# Patient Record
Sex: Male | Born: 1955 | Race: White | Hispanic: No | Marital: Married | Smoking: Never smoker
Health system: Southern US, Community
[De-identification: ages and names within clinical notes are randomized; demographics above are authoritative.]

## PROBLEM LIST (undated history)

## (undated) DIAGNOSIS — T8859XA Other complications of anesthesia, initial encounter: Secondary | ICD-10-CM

## (undated) DIAGNOSIS — I10 Essential (primary) hypertension: Secondary | ICD-10-CM

## (undated) DIAGNOSIS — K219 Gastro-esophageal reflux disease without esophagitis: Secondary | ICD-10-CM

## (undated) DIAGNOSIS — C801 Malignant (primary) neoplasm, unspecified: Secondary | ICD-10-CM

## (undated) DIAGNOSIS — T4145XA Adverse effect of unspecified anesthetic, initial encounter: Secondary | ICD-10-CM

## (undated) DIAGNOSIS — M1990 Unspecified osteoarthritis, unspecified site: Secondary | ICD-10-CM

## (undated) HISTORY — PX: JOINT REPLACEMENT: SHX530

## (undated) HISTORY — PX: APPENDECTOMY: SHX54

## (undated) HISTORY — PX: OTHER SURGICAL HISTORY: SHX169

## (undated) HISTORY — PX: CARPAL TUNNEL RELEASE: SHX101

## (undated) HISTORY — PX: BACK SURGERY: SHX140

---

## 1997-10-01 ENCOUNTER — Emergency Department (HOSPITAL_COMMUNITY): Admission: EM | Admit: 1997-10-01 | Discharge: 1997-10-01 | Payer: Self-pay | Admitting: Emergency Medicine

## 2000-04-05 ENCOUNTER — Ambulatory Visit (HOSPITAL_COMMUNITY): Admission: RE | Admit: 2000-04-05 | Discharge: 2000-04-05 | Payer: Self-pay | Admitting: *Deleted

## 2000-04-05 ENCOUNTER — Encounter (INDEPENDENT_AMBULATORY_CARE_PROVIDER_SITE_OTHER): Payer: Self-pay

## 2000-05-06 ENCOUNTER — Encounter: Payer: Self-pay | Admitting: Family Medicine

## 2000-05-06 ENCOUNTER — Encounter: Admission: RE | Admit: 2000-05-06 | Discharge: 2000-05-06 | Payer: Self-pay | Admitting: Family Medicine

## 2002-08-28 ENCOUNTER — Encounter: Payer: Self-pay | Admitting: Orthopedic Surgery

## 2002-08-28 ENCOUNTER — Encounter: Admission: RE | Admit: 2002-08-28 | Discharge: 2002-08-28 | Payer: Self-pay | Admitting: Orthopedic Surgery

## 2003-03-27 ENCOUNTER — Ambulatory Visit (HOSPITAL_COMMUNITY): Admission: RE | Admit: 2003-03-27 | Discharge: 2003-03-27 | Payer: Self-pay | Admitting: Orthopedic Surgery

## 2003-03-27 ENCOUNTER — Ambulatory Visit (HOSPITAL_BASED_OUTPATIENT_CLINIC_OR_DEPARTMENT_OTHER): Admission: RE | Admit: 2003-03-27 | Discharge: 2003-03-27 | Payer: Self-pay | Admitting: Orthopedic Surgery

## 2003-09-17 ENCOUNTER — Ambulatory Visit (HOSPITAL_COMMUNITY): Admission: RE | Admit: 2003-09-17 | Discharge: 2003-09-17 | Payer: Self-pay | Admitting: *Deleted

## 2003-09-17 ENCOUNTER — Encounter (INDEPENDENT_AMBULATORY_CARE_PROVIDER_SITE_OTHER): Payer: Self-pay | Admitting: Specialist

## 2004-01-02 ENCOUNTER — Ambulatory Visit (HOSPITAL_COMMUNITY): Admission: RE | Admit: 2004-01-02 | Discharge: 2004-01-02 | Payer: Self-pay | Admitting: Orthopedic Surgery

## 2004-01-02 ENCOUNTER — Ambulatory Visit (HOSPITAL_BASED_OUTPATIENT_CLINIC_OR_DEPARTMENT_OTHER): Admission: RE | Admit: 2004-01-02 | Discharge: 2004-01-02 | Payer: Self-pay | Admitting: Orthopedic Surgery

## 2005-08-17 ENCOUNTER — Ambulatory Visit (HOSPITAL_COMMUNITY): Admission: RE | Admit: 2005-08-17 | Discharge: 2005-08-18 | Payer: Self-pay | Admitting: Neurosurgery

## 2006-05-04 ENCOUNTER — Ambulatory Visit: Payer: Self-pay | Admitting: Cardiology

## 2006-05-20 ENCOUNTER — Ambulatory Visit: Payer: Self-pay

## 2006-05-20 ENCOUNTER — Encounter: Payer: Self-pay | Admitting: Cardiology

## 2006-05-26 ENCOUNTER — Ambulatory Visit: Payer: Self-pay | Admitting: Cardiology

## 2008-06-05 ENCOUNTER — Inpatient Hospital Stay (HOSPITAL_COMMUNITY): Admission: RE | Admit: 2008-06-05 | Discharge: 2008-06-07 | Payer: Self-pay | Admitting: Orthopedic Surgery

## 2010-06-23 ENCOUNTER — Other Ambulatory Visit: Payer: Self-pay | Admitting: Dermatology

## 2010-07-27 ENCOUNTER — Encounter (HOSPITAL_BASED_OUTPATIENT_CLINIC_OR_DEPARTMENT_OTHER)
Admission: RE | Admit: 2010-07-27 | Discharge: 2010-07-27 | Disposition: A | Discharge status: Home / Self Care | Payer: BC Managed Care – PPO | Source: Ambulatory Visit | Attending: Orthopedic Surgery | Admitting: Orthopedic Surgery

## 2010-07-27 LAB — POCT I-STAT, CHEM 8
Creatinine, Ser: 1 mg/dL (ref 0.4–1.5)
Hemoglobin: 14.6 g/dL (ref 13.0–17.0)
Sodium: 142 mEq/L (ref 135–145)
TCO2: 30 mmol/L (ref 0–100)

## 2010-07-28 ENCOUNTER — Ambulatory Visit (HOSPITAL_BASED_OUTPATIENT_CLINIC_OR_DEPARTMENT_OTHER)
Admission: RE | Admit: 2010-07-28 | Discharge: 2010-07-28 | Disposition: A | Discharge status: Home / Self Care | Payer: BC Managed Care – PPO | Source: Ambulatory Visit | Attending: Orthopedic Surgery | Admitting: Orthopedic Surgery

## 2010-07-28 DIAGNOSIS — Z85820 Personal history of malignant melanoma of skin: Secondary | ICD-10-CM | POA: Insufficient documentation

## 2010-07-28 DIAGNOSIS — G5600 Carpal tunnel syndrome, unspecified upper limb: Secondary | ICD-10-CM | POA: Insufficient documentation

## 2010-07-28 DIAGNOSIS — I10 Essential (primary) hypertension: Secondary | ICD-10-CM | POA: Insufficient documentation

## 2010-07-28 DIAGNOSIS — Z01812 Encounter for preprocedural laboratory examination: Secondary | ICD-10-CM | POA: Insufficient documentation

## 2010-07-28 DIAGNOSIS — E669 Obesity, unspecified: Secondary | ICD-10-CM | POA: Insufficient documentation

## 2010-07-28 LAB — POCT HEMOGLOBIN-HEMACUE: Hemoglobin: 14.2 g/dL (ref 13.0–17.0)

## 2010-08-11 LAB — CBC
MCHC: 35.1 g/dL (ref 30.0–36.0)
MCHC: 35.3 g/dL (ref 30.0–36.0)
MCV: 95.5 fL (ref 78.0–100.0)
MCV: 97.1 fL (ref 78.0–100.0)
Platelets: 203 10*3/uL (ref 150–400)
Platelets: 245 10*3/uL (ref 150–400)
Platelets: 258 10*3/uL (ref 150–400)
RBC: 3.43 MIL/uL — ABNORMAL LOW (ref 4.22–5.81)
RDW: 13.1 % (ref 11.5–15.5)
RDW: 13.3 % (ref 11.5–15.5)
WBC: 7 10*3/uL (ref 4.0–10.5)
WBC: 9.8 10*3/uL (ref 4.0–10.5)

## 2010-08-11 LAB — COMPREHENSIVE METABOLIC PANEL
AST: 22 U/L (ref 0–37)
Albumin: 4.1 g/dL (ref 3.5–5.2)
Chloride: 101 mEq/L (ref 96–112)
Creatinine, Ser: 0.87 mg/dL (ref 0.4–1.5)
GFR calc Af Amer: >60 mL/min (ref >60)
Total Bilirubin: 0.8 mg/dL (ref 0.3–1.2)
Total Protein: 7 g/dL (ref 6.0–8.3)

## 2010-08-11 LAB — TYPE AND SCREEN
ABO/RH(D): A POS
Antibody Screen: NEGATIVE

## 2010-08-11 LAB — BASIC METABOLIC PANEL
BUN: 12 mg/dL (ref 6–23)
BUN: 19 mg/dL (ref 6–23)
CO2: 30 mEq/L (ref 19–32)
CO2: 32 mEq/L (ref 19–32)
Calcium: 8.5 mg/dL (ref 8.4–10.5)
Calcium: 8.7 mg/dL (ref 8.4–10.5)
Chloride: 102 mEq/L (ref 96–112)
Creatinine, Ser: 0.88 mg/dL (ref 0.4–1.5)
GFR calc Af Amer: >60 mL/min (ref >60)
GFR calc non Af Amer: >60 mL/min (ref >60)
Glucose, Bld: 132 mg/dL — ABNORMAL HIGH (ref 70–99)

## 2010-08-11 LAB — URINALYSIS, ROUTINE W REFLEX MICROSCOPIC
Bilirubin Urine: NEGATIVE
Nitrite: NEGATIVE
Specific Gravity, Urine: 1.012 (ref 1.005–1.030)
Urobilinogen, UA: 0.2 mg/dL (ref 0.0–1.0)
pH: 6.5 (ref 5.0–8.0)

## 2010-08-11 LAB — PROTIME-INR
INR: 1 (ref 0.00–1.49)
INR: 1.1 (ref 0.00–1.49)
Prothrombin Time: 14.5 seconds (ref 11.6–15.2)

## 2010-08-11 LAB — APTT: aPTT: 25 seconds (ref 24–37)

## 2010-08-24 ENCOUNTER — Encounter (HOSPITAL_BASED_OUTPATIENT_CLINIC_OR_DEPARTMENT_OTHER)
Admission: RE | Admit: 2010-08-24 | Discharge: 2010-08-24 | Disposition: A | Discharge status: Home / Self Care | Payer: BC Managed Care – PPO | Source: Ambulatory Visit | Attending: Orthopedic Surgery | Admitting: Orthopedic Surgery

## 2010-08-24 LAB — BASIC METABOLIC PANEL
BUN: 17 mg/dL (ref 6–23)
Calcium: 9.5 mg/dL (ref 8.4–10.5)
GFR calc non Af Amer: >60 mL/min (ref >60)
Glucose, Bld: 125 mg/dL — ABNORMAL HIGH (ref 70–99)
Potassium: 5.2 mEq/L — ABNORMAL HIGH (ref 3.5–5.1)

## 2010-08-26 ENCOUNTER — Ambulatory Visit (HOSPITAL_BASED_OUTPATIENT_CLINIC_OR_DEPARTMENT_OTHER)
Admission: RE | Admit: 2010-08-26 | Discharge: 2010-08-26 | Disposition: A | Discharge status: Home / Self Care | Payer: BC Managed Care – PPO | Source: Ambulatory Visit | Attending: Orthopedic Surgery | Admitting: Orthopedic Surgery

## 2010-08-26 DIAGNOSIS — E669 Obesity, unspecified: Secondary | ICD-10-CM | POA: Insufficient documentation

## 2010-08-26 DIAGNOSIS — M65849 Other synovitis and tenosynovitis, unspecified hand: Secondary | ICD-10-CM | POA: Insufficient documentation

## 2010-08-26 DIAGNOSIS — I10 Essential (primary) hypertension: Secondary | ICD-10-CM | POA: Insufficient documentation

## 2010-08-26 DIAGNOSIS — M65839 Other synovitis and tenosynovitis, unspecified forearm: Secondary | ICD-10-CM | POA: Insufficient documentation

## 2010-08-26 DIAGNOSIS — Z01812 Encounter for preprocedural laboratory examination: Secondary | ICD-10-CM | POA: Insufficient documentation

## 2010-08-26 DIAGNOSIS — G5600 Carpal tunnel syndrome, unspecified upper limb: Secondary | ICD-10-CM | POA: Insufficient documentation

## 2010-08-26 LAB — POCT HEMOGLOBIN-HEMACUE: Hemoglobin: 14.3 g/dL (ref 13.0–17.0)

## 2010-09-08 NOTE — Op Note (Signed)
NAMEJHOSTIN, EPPS               ACCOUNT NO.:  [id number]   MEDICAL RECORD NO.:  [id number]          PATIENT TYPE:  INP   LOCATION:  5024                         FACILITY:  [facility]   PHYSICIAN:  Loreta Ave, M.D. DATE OF BIRTH:  1955-07-01   DATE OF PROCEDURE:  06/05/2008  DATE OF DISCHARGE:                               OPERATIVE REPORT   PREOPERATIVE DIAGNOSIS:  Right knee end-stage degenerative arthritis  with varus alignment.   POSTOPERATIVE DIAGNOSIS:  Right knee end-stage degenerative arthritis  with varus alignment.   PROCEDURE:  Right total knee replacement with Stryker Triathlon  prosthesis.  Modified minimally invasive approach.  Cemented pegged  posterior stabilized #6 femoral component.  Cemented #6 tibial component  with 9-mm polyethylene insert.  Resurfacing peg cemented medial offset  35-mm patellar component.  Soft tissue balancing and medial capsular  release.   SURGEON:  Loreta Ave, MD   ASSISTANT:  Genene Churn. Barry Dienes, [place], present throughout the entire case and  necessary for timely completion of the procedure.   ANESTHESIA:  General.   ESTIMATED BLOOD LOSS:  Minimal.   SPECIMENS:  None.   CULTURES:  None.   COMPLICATIONS:  None.   DRESSINGS:  Sterile compressive.   TOURNIQUET TIME:  1 hour.   DRAIN:  Hemovac x1.   PROCEDURE:  The patient brought to the operating room, placed on the  operating table in supine position.  After adequate anesthesia had been  obtained, right knee examined.  Still fairly good extension and flexion.  Varus alignment correctable to neutral.  Stable ligaments.  Tourniquet  applied.  Prepped and draped in usual sterile fashion.  Exsanguinated  with elevation of Esmarch, tourniquet inflated to 350 mmHg.  Straight  incision above the patella down to the tibial tubercle.  Medial  arthrotomy, vastus splitting preserving quad tendon.  Knee exposed.  Grade 4 changes throughout.  Medial capsular release.  Periarticular  spurs, loose bodies, remnants of menisci, cruciate ligaments excised.  Distal femur exposed.  Intramedullary guide placed.  Distal cut 5  degrees of valgus, 10 mm of resection.  Using epicondylar axis, the  femur was sized, cut, and fitted for a #6 component.  Proximal tibia  exposed.  Extramedullary guide.  A 3-degree posterior slope cut below  the medial defect.  Sized to #6 component.  After soft tissue balancing,  trial was in place.  A #6 on the femur and #6 on the tibia.  With a 9-mm  insert, I had a nicely balanced knee with full extension, full flexion,  good alignment, and good stability.  Patella exposed.  Spurs removed.  Posterior 10 mm removed.  Sized, drilled, and fitted for a 35-mm  component with excellent tracking at completion.  All trials removed.  All recess examined to be sure all loose fragments and spurs removed.  Copious irrigation with a pulse irrigating device.  Cement prepared and  placed on all components, which were firmly seated.  Polyethylene  attached to the tibia, knee reduced.  Once these cement had hardened,  knee was examined.  Full extension, full flexion, good alignment,  good  stability, good patellofemoral tracking.  Hemovac was placed, brought  out through a separate stab wound.  Arthrotomy closed with #1 Vicryl,  skin and subcutaneous tissue with Vicryls and was stable.  Knee injected  with Marcaine.  Hemovac clamped.  Sterile compressive dressing applied.  Tourniquet deflated and removed.  Knee immobilizer applied.  Anesthesia  reversed.  Brought to the recovery room.  Tolerated the surgery well.  No complications.      Loreta Ave, M.D.  Electronically Signed     DFM/MEDQ  D:  06/05/2008  T:  06/06/2008  Job:  [id number]

## 2010-09-11 NOTE — Assessment & Plan Note (Signed)
[facility]                            CARDIOLOGY OFFICE NOTE   NAME:[name]                      MRN:          [id number]  DATE:05/04/2006                            DOB:          February 11, 1956    REFERRING PHYSICIAN:  Ursula Beath, MD   REASON FOR CONSULTATION:  Dizziness, chest pain, and PVCs.   HISTORY OF PRESENT ILLNESS:  [name] is a pleasant 55 year old male  with obesity, long-standing hypertension, mildly elevated LDL  cholesterol documented in the past at 129, and a family history of  premature cardiovascular disease.  He has no personal history of  coronary artery disease and states he had a cardiac catheterization  approximately 10 to 15 years ago that was normal.  He has not had any  interval ischemic followup.  Symptomatically, he has been stable until  the last 6 to 8 weeks.  He has been experiencing intermittent brief  episodes of dizziness/light-headedness associated with a sense of  palpitations and discomfort in his neck.  He has had some feeling of  racing heart in the evenings, and also a feeling of chest tightness with  these symptoms, but not specifically with activity.  He has had no frank  syncope and denies any reproducible exertional chest discomfort.  He  wore a 24h Holter monitor, which reported premature ventricular  complexes with some ventricular bigeminy and trigeminy, but no sustained  arrhythmias.  His resting electrocardiogram today shows sinus rhythm  with a single premature ventricular complex, and no evidence of  transmural infarct.  He is somewhat functionally limited due to what  sounds like fairly significant osteoarthritis involving the knees.  He  has not been exercising regularly and states that his weight has  increased.  He has also had some increased psychosocial stressors.   ALLERGIES:  PENICILLIN.   PRESENT MEDICATIONS:  1. Zyrtec 10 mg p.o. daily.  2. Quinaretic 20/12.5 mg p.o.  daily.  3. Aspirin 81 mg p.o. daily.  4. Vitamin B6 100 mg p.o. daily.  5. Osteo Bi-Flex 2 daily.   PAST MEDICAL HISTORY:  As outlined above.  Additional problems include  arthroscopic surgery on both knees over the last few years.  He has also  had a lower lumbar laminectomy.   SOCIAL HISTORY:  The patient is married.  He has 2 children.  He works  as a Merchandiser, retail.  He denies any tobacco use.  He drinks  alcohol socially and on the weekends.   FAMILY HISTORY:  Significant for heart disease in the patient's father  who had a myocardial infarction at age 81.   REVIEW OF SYSTEMS:  As described in the history of present illness.  He  does have some seasonal allergies.   EXAMINATION:  Blood pressure 145/80, heart rate is 80, weight 283  pounds.  This is a morbidly obese male in no acute distress.  HEENT:  Conjunctivae was normal.  Oropharynx was clear.  NECK:  Supple without elevated jugular venous pressure or loud bruits.  No thyromegaly is noted.  LUNGS:  Clear without labored breathing at rest.  CARDIAC:  Regular rate and rhythm without loud murmur or S3 gallop.  There is no pericardial rub.  ABDOMEN:  Soft.  No obvious hepatomegaly.  No loud bruits or tenderness.  EXTREMITIES:  Show 1+ edema below the knees.  SKIN:  Warm and dry.  Tattoos noted on upper arms and left chest.  No  ulcerative changes are noted.  MUSCULOSKELETAL:  No kyphosis noted.  NEURO/PSYCHIATRIC:  The patient is alert and oriented x3.   IMPRESSION/RECOMMENDATIONS:  1. Recent symptoms of palpitations, dizziness, and chest pain as      outlined above.  Resting electrocardiogram is fairly nonspecific.      Premature ventricular complexes have been noted on Holter      monitoring.  He has had no syncope and his symptoms have been brief      and not clearly associated with exertion.  He has at least moderate      pre-test probability for coronary artery disease and has not had      any recent  screening since a reported remote cardiac      catheterization.  We discussed this today and our plan will be an      adenosine Myoview and a 2D echocardiogram with subsequent followup      to review the results.  We can then proceed from there.  2. Further plans to follow.     Jonelle Sidle, MD  Electronically Signed    SGM/MedQ  DD: 05/04/2006  DT: 05/04/2006  Job #: [id number]   cc:   Ursula Beath, MD

## 2010-09-11 NOTE — Procedures (Signed)
[facility]  Patient:    [name], [name]                      MRN: [id number] Proc. Date: 04/05/00 Adm. Date:  [id number] Disc. Date: [id number] Attending:  Sabino Gasser CCNolon Nations, M.D., M.P.H.   Procedure Report  PROCEDURE:  Colonoscopy with polypectomy.  INDICATIONS:  See dictated clinical note.  ANESTHESIA: Demerol 110 mg and Versed 11 mg.  PROCEDURE:  With the patient mildly sedated in the left lateral decubitus position, the Olympus video endoscopic colonoscope was inserted in the rectum after a normal rectal exam was performed and was passed easily under direct vision to the cecum.  The cecum was identified by the ileocecal valve and appendiceal orifice, both of which were photographed.  From this point, the colonoscope was slowly withdrawn, taking circumferential views in the entire colonic mucosa, stopping at 20 cm from the anal verge, at which point a small polyp approximately 4 mm in size, round, sessile.  This was seen, photographed, and removed using snare cautery technique at a setting of 3 and 3.1 current.  The tissue was retrieved for pathology, and the scope was withdrawn further to the rectum, which appeared normal.  Direct views showed internal hemorrhoids in retroflexed views.  The endoscope was straightened and withdrawn.  The patients vital signs and pulse oximeter remained stable.  The patient tolerated the procedure well without apparent complications.  FINDINGS: 1. Small polyp at 20 cm. 2. Internal hemorrhoids.  PLAN:  Await biopsy report.  The patient will call me for results and follow up with me as an outpatient. DD:  04/05/00 TD:  04/05/00 Job: [id number]

## 2010-09-11 NOTE — Op Note (Signed)
[name], [name]                         ACCOUNT NO.:  [id number]   MEDICAL RECORD NO.:  [id number]                   PATIENT TYPE:  AMB   LOCATION:  DSC                                  FACILITY:  [facility]   PHYSICIAN:  Loreta Ave, M.D.              DATE OF BIRTH:  [birth year]   DATE OF PROCEDURE:  01/02/2004  DATE OF DISCHARGE:                                 OPERATIVE REPORT   PREOPERATIVE DIAGNOSIS:  Osteochondral defect medial femoral condyle, left  knee.   POSTOPERATIVE DIAGNOSIS:  Osteochondral defect medial femoral condyle, left  knee, with some grade 2 chondromalacia of patella.   PROCEDURE:  Left knee exam under anesthesia, arthroscopy, chondroplasty of  patella and medial femoral condyle, open medial arthrotomy with  osteochondral allograft arthroplasty, Oats procedure, 20 mm osteochondral  graft plus a 15 mm osteochondral graft, all to the medial femoral condyle.   SURGEON:  Loreta Ave, M.D.   ASSISTANT:  [name], P.A.-C.   ANESTHESIA:  General.   ESTIMATED BLOOD LOSS:  Minimal.   TOURNIQUET TIME:  1 hour 30 minutes.   SPECIMENS:  None.   CULTURES:  None.   COMPLICATIONS:  None.   DRESSINGS:  Soft compressive with knee immobilizer.   PROCEDURE:  The patient was brought to the operating room and placed on the  operating table in supine position.  After adequate anesthesia had been  obtained, the left knee was examined.  Full motion, good stability.  Tourniquet applied, prepped and draped in the usual sterile fashion.  Exsanguinated with elevation of Esmarch and tourniquet inflated to 350 mmHg.  Three portals were created, one superolateral and one each medial and  lateral parapatellar.  Inflow catheter introduced, knee distended,  arthroscope introduced, knee inspected.  Some grade 2 changes of the patella  easily debrided.  Significant improvement from his chondroplasty in 2004.  Good tracking.  Lateral meniscus, lateral  compartment, and cruciate  ligaments intact.  The medial compartment had a grade 4 lesion, weight  bearing dome, medial femoral condyle about 18 mm in diameter from side to  side but then extending almost 25 mm from the posterior to anterior aspect.  Much of the weight-bearing dome of the condyle.  Previous grade 4 on his  previous arthroscopy with a little bit of fibrocartilage covering it,  nothing protective, and not significant cartilage.  The remaining joint  space looked good, the remaining articular cartilage on the tibia looked  good.  Sufficient focal changes to allow treatment with an Oats allograft  transplantation arthroplasty.  The instruments and fluid were removed.   Medial parapatellar arthrotomy.  Medial joint space exposed protecting  medial meniscus.  The lesion on the condyle was identified and sized for two  grafts, a 20 mm circular graft on the posterior aspect and a 15 mm circular  graft just anterior to that.  This gave, at completion, nice restoration  of  articular cartilage over almost the entire weight bearing dome medial  femoral condyle filling the defects nicely.  With the Oats instrumentation,  it was sized first for the 20 mm posterior graft.  The allograft, which was  a fresh allograft, prepared and put in the appropriate cutting jig to match  the sizing for the graft to be implanted.  The posterior graft was created  with first a guide-wire appropriate sizing and then routing to a depth of 10  mm which gave good bone throughout and a reasonable bed of  normal articular  cartilage all the way around except very anterior aspect where a second  graft was to be placed.  Measured for 10 mm graft.  The same place on the  allograft condyle was then chosen and with the appropriate jig, a graft was  harvested.  It was cut for appropriate depth and sized for the recipient  site.  The donor graft, after being appropriately prepared and sized, was  carefully driven  into place into the defect and seated extremely well with a  good recontouring of the entire articular cartilage surface and excellent  bony capturing.  Care was taken to make this the same contour and height of  the remaining normal articular cartilage.  When that was complete, the exact  same procedure was done just anterior to the other defect creating an 8 mm  deep, 15 mm diameter, recipient site.  Again, the autograft was used and the  graft harvested from the same area on the allograft.  Once that was sized  for 8 mm depth, it was driven into place.  At completion, both grafts had  excellent restoration of articular cartilage over the area of defect.  Both  fit well, contoured smoothly, and were of the normal height of articular  cartilage and well captured and positioned.  I was pleased with the overall  restoration of articular cartilage and appearance.  The wound and knee was  thoroughly irrigated.  The arthrotomy closed with Vicryl deep and then  subcuticular Vicryl superficial, the portals were closed with nylon.  Margins of the wound injected with Marcaine.  Sterile compressive dressing  applied.  Tourniquet deflated and removed.  Knee immobilizer applied.  Anesthesia reversed.  Brought to the recovery room.  Tolerated the surgery  well without complications.                                               Loreta Ave, M.D.    DFM/MEDQ  D:  01/03/2004  T:  01/03/2004  Job:  [id number]

## 2010-09-11 NOTE — Op Note (Signed)
[name], [name]                         ACCOUNT NO.:  [id number]   MEDICAL RECORD NO.:  [id number]                   PATIENT TYPE:  AMB   LOCATION:  DSC                                  FACILITY:  [facility]   PHYSICIAN:  Loreta Ave, M.D.              DATE OF BIRTH:  [birth year]   DATE OF PROCEDURE:  03/27/2003  DATE OF DISCHARGE:                                 OPERATIVE REPORT   PREOPERATIVE DIAGNOSES:  Right knee chondromalacia patella, chondromalacia  medial compartment and medial meniscal tear.   POSTOPERATIVE DIAGNOSES:  Right knee chondromalacia patella, chondromalacia  medial compartment and medial meniscal tear with numerous chondral loose  bodies.   OPERATION PERFORMED:  Right knee examination under anesthesia, arthroscopy  with partial medial meniscectomy.  Removal of chondral loose bodies.  Chondroplasty patella and medial femoral condyle.   SURGEON:  Loreta Ave, M.D.   ASSISTANT:  Arlys John D. Petrarca, P.A.-C.   ANESTHESIA:  General.   ESTIMATED BLOOD LOSS:  Minimal.   TOURNIQUET TIME:  Not employed.   SPECIMENS:  None.   CULTURES:  None.   COMPLICATIONS:  None.   DRESSING:  Soft compressive.   DESCRIPTION OF PROCEDURE:  The patient was brought to the operating room and  after adequate anesthesia had been obtained, the right knee examined.  Good  motion and stability.  Tourniquet and leg holder applied.  Leg prepped and  draped in the usual sterile fashion.  Three portals were created, one  superolateral, one each medial and lateral parapatellar.  Inflow catheter  introduced.  Knee distended.  Arthroscope introduced.  Knee inspected.  Numerous chondral loose bodies throughout.  All were removed.  Most of these  were felt to be coming from the medial femoral condyle lesion.  Grade 3  lesion diffusely on the patella at the peak as well as  medial facet.  Chondroplasty to a stable surface.  Reasonable tracking, no tethering and  the trochlea  looked good.  Smooth surface on the patella at completion.  Cruciate ligaments intact.  Lateral compartment, lateral meniscus normal.  Medial compartment extensive tearing medial meniscus posterior half.  Taken  all the way out to the rim, tapered in to remaining meniscus, salvaging the  anterior half.  Diffuse grade 3 chondral changes entire weightbearing dome,  medial femoral condyle.  Chondroplasty to a stable surface removing loose  bodies.  The tibial plateau looked good. This was a diffuse area of thinning  over the entire weightbearing region medial femoral condyle.  At completion  all  recess examined to be sure all loose bodies were removed.  Instruments and  fluid removed.  Portals of the knee injected with Marcaine.  Portals were  closed with 4-0 nylon.  Sterile compressive dressing applied.  Anesthesia  reversed.  Brought to recovery room.  Tolerated surgery well.  No  complications.  Loreta Ave, M.D.    DFM/MEDQ  D:  03/27/2003  T:  03/27/2003  Job:  [id number]

## 2010-09-11 NOTE — Op Note (Signed)
[name], [name]                         ACCOUNT NO.:  [id number]   MEDICAL RECORD NO.:  [id number]                   PATIENT TYPE:  AMB   LOCATION:  ENDO                                 FACILITY:  [facility]   PHYSICIAN:  Georgiana Spinner, M.D.                 DATE OF BIRTH:  10-19-1955   DATE OF PROCEDURE:  DATE OF DISCHARGE:                                 OPERATIVE REPORT   PROCEDURE PERFORMED:  Colonoscopy.   ENDOSCOPIST:  Georgiana Spinner, M.D.   INDICATIONS FOR PROCEDURE:  Colon polyps.   ANESTHESIA:  Demerol 25 mg, Versed 3 mg.   DESCRIPTION OF PROCEDURE:  With the patient mildly sedated in the left  lateral decubitus position, the Olympus video colonoscope was inserted in  the rectum and passed under direct vision to the cecum, identified by the  ileocecal valve and appendiceal orifice, both of which were photographed.  From this point the colonoscope was slowly withdrawn taking circumferential  views of the colonic mucosa stopping at 20 cm from the anal verge at which  point a small polyp was seen, photographed and removed using hot biopsy  forceps technique setting 20/200 with the Erbe pulse generator.  The  endoscope was then withdrawn to the rectum which appeared normal on direct  and retroflex view.  The endoscope was straightened and withdrawn.  The  patient's vital signs and pulse oximeter remained stable.  The patient  tolerated the procedure well without apparent complications.   FINDINGS:  Small polyp at 20 cm from the anal verge.  Otherwise unremarkable  colonoscopic examination to the cecum.   PLAN:  Await biopsy report.  Patient will call me for results and follow up  with me as an outpatient.                                               Georgiana Spinner, M.D.    GMO/MEDQ  D:  09/17/2003  T:  09/17/2003  Job:  [id number]   cc:   Marjory Lies, M.D.  [address]  [place]  [place] [address]  Fax: [phone]

## 2010-09-11 NOTE — Op Note (Signed)
[name], [name]                         ACCOUNT NO.:  [id number]   MEDICAL RECORD NO.:  [id number]                   PATIENT TYPE:  AMB   LOCATION:  ENDO                                 FACILITY:  [facility]   PHYSICIAN:  Georgiana Spinner, M.D.                 DATE OF BIRTH:  May 06, 1955   DATE OF PROCEDURE:  09/17/2003  DATE OF DISCHARGE:                                 OPERATIVE REPORT   PROCEDURE PERFORMED:  Upper endoscopy.   ENDOSCOPIST:  Georgiana Spinner, M.D.   INDICATIONS FOR PROCEDURE:  Gastroesophageal reflux disease.   ANESTHESIA:  Demerol 75 mg, Versed 5 mg.   DESCRIPTION OF PROCEDURE:  With the patient mildly sedated in the left  lateral decubitus position, the Olympus video endoscope was inserted in the  mouth and passed under direct vision through the esophagus which appeared  normal.  There was no evidence of Barrett's.  We entered into the stomach.  The fundus, body, antrum, duodenal bulb and second portion of the duodenum  all appeared normal.  From this point, the endoscope was slowly withdrawn  taking circumferential views of the duodenal mucosa until the endoscope was  pulled back into the stomach and placed on retroflexion to view the stomach  from below.  The endoscope was then straightened and withdrawn taking  circumferential views of the remaining gastric and esophageal mucosa.  The  patient's vital signs and pulse oximeter remained stable.  The patient  tolerated the procedure well without apparent complications.   FINDINGS:  Unremarkable examination without evidence of Barrett's esophagus.   PLAN:  Proceed to colonoscopy.                                               Georgiana Spinner, M.D.    GMO/MEDQ  D:  09/17/2003  T:  09/17/2003  Job:  [id number]   cc:   Marjory Lies, M.D.  [address]  [place]  [place] [address]  Fax: [phone]

## 2010-09-11 NOTE — Assessment & Plan Note (Signed)
[facility]                            CARDIOLOGY OFFICE NOTE   NAME:Sistrunk, TRUNG WENZL                      MRN:          [id number]  DATE:05/26/2006                            DOB:          [birth year]    FOLLOWUP VISIT   PRIMARY CARE PHYSICIAN:  Dr. Ursula Beath.   REASON FOR VISIT:  Followup cardiac testing.   HISTORY OF PRESENT ILLNESS:  I saw Mr. Gortney back on January 9.  His  history is detailed on previous note.  I arranged cardiac risk  stratification for further assessment and he underwent an adenosine  Myoview which revealed no evidence of ischemia or scar with a normal  ejection fraction of 60%.  He also underwent a 2-D echocardiogram, also  showing normal left ventricular systolic function of 60% with no  regional wall motion abnormalities or major valvular abnormalities.  I  reviewed these with the patient and his wife today.  He continues to  have some symptoms of palpitations and we talked about stress reduction,  weight loss, and more of an effort at exercise and risk factor  modification.  I explained to him that his testing was overall low risk  and is good at excluding severe flow limiting coronary artery disease  but does not pick up mild atherosclerosis which would still need to be  addressed via risk factor modification, although would not necessarily  give him symptoms or require invasive treatment.  We talked about some  strategies to consider as far as weight loss and exercise and he tells  me that he will plan to pursue this.   ALLERGIES:  PENICILLIN.   PRESENT MEDICATIONS:  1. Zyrtec 10 mg p.o. daily.  2. Quinaretic 20/12.5 mg p.o. daily.  3. Aspirin 81 mg p.o. daily.  4. Vitamin B-6 100 mg p.o. daily.  5. Osteo Bi-Flex 2 tablets daily.   REVIEW OF SYSTEMS:  As described in the history of present illness.   EXAMINATION:  Blood pressure is 139/86.  Heart rate is 74.  Weight is  280 pounds.  He is comfortable  in no acute distress.  No major change in his baseline  examination.   IMPRESSION/RECOMMENDATION:  1. History of palpitations, occasional dizziness and intermittent      chest pain, although with reassuring low-risk cardiac testing as      well as a history of previous normal cardiac catheterization      approximately 10 to 15 years ago.  I suspect that stress, increased      weight, and deconditioning are all factors here and I have      recommended aggressive risk factor modification including follow up      with his primary care Anshika Pethtel, exercise, and a goal of weight      loss.  He seems motivated to pursue this and, at this point, we      will not anticipate      any additional cardiac studies.  2. Continue regular followup with Dr. Raquel James.  We can certainly see      him back as needed.  Jonelle Sidle, MD  Electronically Signed    SGM/MedQ  DD: 05/26/2006  DT: 05/26/2006  Job #: [id number]   cc:   Ursula Beath, MD

## 2010-09-11 NOTE — Op Note (Signed)
NAMECAMRON, [name]               ACCOUNT NO.:  [id number]   MEDICAL RECORD NO.:  [id number]          PATIENT TYPE:  OIB   LOCATION:  3014                         FACILITY:  [facility]   PHYSICIAN:  Reinaldo Meeker, M.D. DATE OF BIRTH:  09/10/1955   DATE OF PROCEDURE:  08/17/2005  DATE OF DISCHARGE:                                 OPERATIVE REPORT   PREOPERATIVE DIAGNOSIS:  Spondylosis, L4-5, with bilateral L5 radiculopathy.   POSTOPERATIVE DIAGNOSIS:  Spondylosis, L4-5, with bilateral L5  radiculopathy.   PROCEDURE:  Bilateral L4-5 decompressive laminotomies.   SECONDARY PROCEDURE:  Microdissection, L5 nerve root bilaterally.   SURGEON:  Reinaldo Meeker, M.D.   ASSISTANT:  Kathaleen Maser. Pool, M.D.   PROCEDURE IN DETAIL:  After placed in the prone position, the patient's back  was prepped and draped in the usual sterile fashion.  Localizing x-rays were  taken prior to incision to identify the appropriate level.  A midline  incision was made over the spinous processes of L4 and L5.  Using the Bovie,  __________, and curette, the incision was carried down to the spinous  processes.  Subperiosteal dissection was then carried out bilaterally on the  spinous processes and lamina.  A self-retaining retractor was placed for  exposure.  X-ray showed we had approached the appropriate level.  Starting  on the patient's left side, laminotomy was performed by removing the  inferior one-half of the L4 lamina, the medial one-third of the facet joint,  the superior one-third of the L5 lamina.  Residual bone and hypertrophic  ligamentum flavum were removed in a piecemeal fashion.  Similar  decompression was then carried out on the patient's right side, once again  removing the inferior one half of the L4 lamina, the medial one third of the  facet joint, and the superior one third of the L5 lamina.  Once again,  residual bone and ligamentum flavum were removed in a piecemeal fashion.  At  this time,  using microdissection technique, generous foraminotomies were  carried out at the L5 nerve roots bilaterally.  Inspection was carried out  at the floor of the canal to identify the L4-5 disk, which was found to be  unremarkable.  At this time, inspection was carried out bilaterally for any  evidence of residual compression, and none could be identified.  Large  amounts of irrigation were carried out.  Any bleeding was controlled with  coagulation and Gelfoam.  The wound was then closed in multiple layers of  Vicryl on the muscle, fascia, subcutaneous, and subcuticular tissues, and  staples were placed on the skin.  A sterile dressing was then applied, and  the patient was extubated and taken to the recovery room in stable  condition.           ______________________________  Reinaldo Meeker, M.D.     ROK/MEDQ  D:  08/17/2005  T:  08/17/2005  Job:  [id number]

## 2010-11-13 NOTE — Op Note (Signed)
NAMEKRISTA, [name]               ACCOUNT NO.:  [id number]  MEDICAL RECORD NO.:  [id number]          PATIENT TYPE:  LOCATION:                                 FACILITY:  PHYSICIAN:  Cindee Salt, M.D.            DATE OF BIRTH:  DATE OF PROCEDURE:  08/26/2010 DATE OF DISCHARGE:                              OPERATIVE REPORT   PREOPERATIVE DIAGNOSIS:  Carpal tunnel syndrome, right hand, stenosing tenosynovitis, right thumb.  POSTOPERATIVE DIAGNOSIS:  Carpal tunnel syndrome, right hand, stenosing tenosynovitis, right thumb.  OPERATION:  Decompression median nerve, right wrist.  Release of A1 pulley, right thumb.  SURGEON:  Cindee Salt, MD  ANESTHESIA:  General with local infiltration.  DATE OF OPERATION:  Aug 26, 2010.  ANESTHESIOLOGIST:  Dr. Gelene Mink.  HISTORY:  The patient is a 55 year old male with a history of carpal tunnel syndrome, EMG nerve conductions positive, triggering of his right thumb which has not responded to conservative treatments.  He has elected to undergo surgical decompression of each problem.  He is aware of risks and complications including infection, recurrence injury to arteries, nerves, tendons, incomplete relief of symptoms, dystrophy.  In preoperative area, the patient was seen.  The extremity was marked by both the patient and surgeon.  Antibiotic given.  PROCEDURE:  The patient was brought to the operating room where a general anesthetic was carried out without difficulty.  He was prepped using ChloraPrep, supine position, right arm free, a 3-minute dry time was allowed.  Time-out taken confirming the patient and procedure.  The limb was exsanguinated with an Esmarch bandage.  Tourniquet was placed on the forearm was inflated to 250 mmHg.  A straight incision was made over the palm carried down through subcutaneous tissue.  Bleeders were electrocauterized, palmar fascia was split, superficial palmar arch was identified.  The flexor tendon to  the ring little finger identified to the ulnar side of the median nerve.  The carpal retinaculum was incised with sharp dissection.  Right-angle and Sewall retractor were placed between the skin and forearm fascia.  Fascia was released for approximately a centimeter and a half proximal to the wrist crease under direct vision.  Anomalous muscle appeared to be in palmaris profundus was noted.  This was not removed.  Wound was irrigated.  Area compression to the nerve was apparent.  No further lesions were identified.  The wound was closed with interrupted 5-0 Vicryl Rapide sutures.  Separate incision was then made transversely at the metacarpophalangeal joint crease of the thumb carried down through subcutaneous tissue.  Bleeders again electrocauterized.  Neurovascular structures were identified and protected to the radial side of the A1 pulley.  The A1 pulley was released.  The oblique pulley was left intact.  Finger placed through full range motion, no further triggering was noted.  The wound was irrigated.  Skin was closed with interrupted 5- 0 Vicryl Rapide sutures.  A local infiltration with 0.25% Marcaine without epinephrine was given in each incision.  A sterile compressive dressing and splint to the wrist with fingers free was applied. Deflation of the tourniquet.  All fingers immediately pinked.  He was taken to the recovery room for observation in satisfactory condition. He will be discharged home to return to the [facility] of [place] in 1 week on Vicodin.          ______________________________ Cindee Salt, M.D.     GK/MEDQ  D:  08/26/2010  T:  08/27/2010  Job:  [id number]  Electronically Signed by Cindee Salt M.D. on 11/13/2010 09:06:06 AM

## 2010-11-13 NOTE — Op Note (Signed)
  NAMEMERRILL, [name]               ACCOUNT NO.:  [id number]  MEDICAL RECORD NO.:  [id number]          PATIENT TYPE:  LOCATION:                                 FACILITY:  PHYSICIAN:  Cindee Salt, M.D.            DATE OF BIRTH:  DATE OF PROCEDURE:  07/28/2010 DATE OF DISCHARGE:                              OPERATIVE REPORT   PREOPERATIVE DIAGNOSIS:  Carpal tunnel syndrome left hand.  POSTOPERATIVE DIAGNOSIS:  Carpal tunnel syndrome left hand.  OPERATION:  Decompression left median nerve.  SURGEON:  Cindee Salt, MD  ASSISTANT:  Betha Loa, MD  ANESTHESIA:  Forearm based IV regional with local infiltration.  ANESTHESIOLOGIST:  Dr. Jean Rosenthal.  HISTORY:  The patient is a 55 year old male with a history of carpal tunnel syndrome, EMG nerve conduction is positive which has not responded to conservative treatment.  He has elected to undergo surgical decompression.  Pre, peri and postoperative course have been discussed along with risks and complications.  He is aware that there is no guarantee with surgery, possibility of infection, recurrence injury to arteries, nerves, tendons, incomplete relief of symptoms and dystrophy. Preoperative area the patient is seen.  The extremity marked by both the patient and surgeon.  Antibiotic given.  PROCEDURE:  The patient is brought to the operating room where forearm based IV regional anesthetic was carried out without difficulty.  He was prepped using ChloraPrep, supine position, left arm free.  A 3-minute dry time was allowed.  Time-out taken confirming the patient and procedure.  He had some continued feeling.  A local anesthetic was given with 0.25% Marcaine without epinephrine in the line of the incision approximately 8 mL was used.  A longitudinal incision was made and carried down through subcutaneous tissue.  Bleeders were electrocauterized.  Palmar fascia was split, superficial palmar arch identified, flexor tendon to the ring and  little finger identified.  To the ulnar side of median nerve, the carpal retinaculum was incised with sharp dissection.  A right-angle and Sewall retractor were placed between skin and forearm fascia.  The fascia released for approximately a centimeter and half proximal to the wrist crease under direct vision. Canal was explored.  Area of compression to the nerve was apparent. Tenosynovial tissue was moderately thickened.  The wound was irrigated, and the skin closed with interrupted 5-0 Vicryl Rapide sutures.  Sterile compressive dressing and splint to the wrist with the fingers free was applied.  Deflation of the tourniquet, all fingers immediately pinked.  He was taken to the recovery room for observation in satisfactory condition.  He will be discharged home to return to the [facility] of [place] in 1 week on Vicodin.          ______________________________ Cindee Salt, M.D.     GK/MEDQ  D:  07/28/2010  T:  07/29/2010  Job:  [id number]  Electronically Signed by Cindee Salt M.D. on 11/13/2010 09:05:56 AM

## 2011-02-03 ENCOUNTER — Encounter (HOSPITAL_BASED_OUTPATIENT_CLINIC_OR_DEPARTMENT_OTHER)
Admission: RE | Admit: 2011-02-03 | Discharge: 2011-02-03 | Disposition: A | Discharge status: Home / Self Care | Payer: BC Managed Care – PPO | Source: Ambulatory Visit | Attending: Orthopedic Surgery | Admitting: Orthopedic Surgery

## 2011-02-03 LAB — BASIC METABOLIC PANEL
BUN: 17 mg/dL (ref 6–23)
Calcium: 9.8 mg/dL (ref 8.4–10.5)
Creatinine, Ser: 0.94 mg/dL (ref 0.50–1.35)
GFR calc Af Amer: >90 mL/min (ref >90)
GFR calc non Af Amer: >90 mL/min (ref >90)

## 2011-02-08 ENCOUNTER — Ambulatory Visit (HOSPITAL_BASED_OUTPATIENT_CLINIC_OR_DEPARTMENT_OTHER)
Admission: RE | Admit: 2011-02-08 | Discharge: 2011-02-08 | Disposition: A | Discharge status: Home / Self Care | Payer: BC Managed Care – PPO | Source: Ambulatory Visit | Attending: Orthopedic Surgery | Admitting: Orthopedic Surgery

## 2011-02-08 DIAGNOSIS — M6740 Ganglion, unspecified site: Secondary | ICD-10-CM | POA: Insufficient documentation

## 2011-02-08 DIAGNOSIS — M6530 Trigger finger, unspecified finger: Secondary | ICD-10-CM | POA: Insufficient documentation

## 2011-02-08 DIAGNOSIS — Z01810 Encounter for preprocedural cardiovascular examination: Secondary | ICD-10-CM | POA: Insufficient documentation

## 2011-02-08 DIAGNOSIS — M65839 Other synovitis and tenosynovitis, unspecified forearm: Secondary | ICD-10-CM | POA: Insufficient documentation

## 2011-02-08 DIAGNOSIS — Z01812 Encounter for preprocedural laboratory examination: Secondary | ICD-10-CM | POA: Insufficient documentation

## 2011-02-08 LAB — POCT HEMOGLOBIN-HEMACUE: Hemoglobin: 15 g/dL (ref 13.0–17.0)

## 2011-02-09 NOTE — Op Note (Signed)
  [name], [name]               ACCOUNT NO.:  [id number]  MEDICAL RECORD NO.:  [id number]  LOCATION:                                 FACILITY:  PHYSICIAN:  Cindee Salt, M.D.       DATE OF BIRTH:  01-Feb-1956  DATE OF PROCEDURE:  02/08/2011 DATE OF DISCHARGE:                              OPERATIVE REPORT   PREOPERATIVE DIAGNOSIS: Stenosing tenosynovitis, left thumb.  POSTOPERATIVE DIAGNOSIS:  Stenosing tenosynovitis, left thumb plus flexor sheath cyst.  OPERATION:  Release A1 pulley, excision flexor sheath cyst, left thumb.  SURGEON:  Cindee Salt, MD  ANESTHESIA:  General with local infiltration.  ANESTHESIOLOGIST:  Zenon Mayo, MD  HISTORY:  The patient is a 55 year old male with a history of triggering of his left thumb, this has been painful for him and has not responded to conservative treatment.  He has elected to undergo surgical release of the A1 pulley.  Pre, peri, and postoperative course have been discussed along with risks and complications.  He is aware there is no guarantee with the surgery, possibility of infection, recurrence injury to arteries, nerves, tendons, incomplete relief of symptoms and dystrophy.  Preoperative area the patient is seen.  The extremity marked by both the patient and surgeon.  Antibiotic given.  PROCEDURE:  The patient was brought to the operating room where a general anesthetic was carried out without difficulty.  He was prepped using ChloraPrep, supine position with the left arm free.  The limb was exsanguinated with an Esmarch bandage, 3 minutes dry time allowed.  Time- out taken confirming the patient and procedure.  A transverse incision was made over the A1 pulley of the left thumb and carried down through the subcutaneous tissue.  A cyst was immediately encountered.  With blunt and sharp dissection, this was dissected free and removed.  The A1 pulley was then released on its radial aspect protecting  neurovascular structures radially and ulnarly.  No further triggering was noted.  The wound was irrigated.  The skin closed with interrupted 4-0 Vicryl Rapide sutures.  The local infiltration with 0.25% Marcaine without epinephrine was given.  Sterile compressive dressing was applied. On deflation of the tourniquet, all fingers immediately pinked.  He was taken to the recovery room for observation in satisfactory condition. He will be discharged home to return to the [facility] of [place] in 1 week on Vicodin.          ______________________________ Cindee Salt, M.D.     GK/MEDQ  D:  02/08/2011  T:  02/09/2011  Job:  [id number]  Electronically Signed by Cindee Salt M.D. on 02/09/2011 04:54:09 PM

## 2011-09-27 ENCOUNTER — Other Ambulatory Visit: Payer: Self-pay | Admitting: Dermatology

## 2012-03-28 ENCOUNTER — Other Ambulatory Visit: Payer: Self-pay | Admitting: Dermatology

## 2013-07-02 ENCOUNTER — Other Ambulatory Visit: Payer: Self-pay | Admitting: Dermatology

## 2014-01-10 ENCOUNTER — Other Ambulatory Visit: Payer: Self-pay | Admitting: Dermatology

## 2014-08-06 ENCOUNTER — Other Ambulatory Visit: Payer: Self-pay | Admitting: Dermatology

## 2014-11-12 ENCOUNTER — Other Ambulatory Visit: Payer: Self-pay | Admitting: Physician Assistant

## 2014-11-12 NOTE — H&P (Signed)
TOTAL KNEE ADMISSION H&P  Patient is being admitted for left total knee arthroplasty.  Subjective:  Chief Complaint:left knee pain.  HPI: [name], 59 y.o. male, has a history of pain and functional disability in the left knee due to arthritis and has failed non-surgical conservative treatments for greater than 12 weeks to includeNSAID's and/or analgesics and corticosteriod injections.  Onset of symptoms was gradual, starting 5 years ago with gradually worsening course since that time. The patient noted no past surgery on the left knee(s).  Patient currently rates pain in the left knee(s) at 7 out of 10 with activity. Patient has night pain and crepitus.  Patient has evidence of subchondral sclerosis and joint space narrowing by imaging studies. There is no active infection.  There are no active problems to display for this patient.  No past medical history on file.  No past surgical history on file.   (Not in a hospital admission) Allergies not on file  History  Substance Use Topics  . Smoking status: Not on file  . Smokeless tobacco: Not on file  . Alcohol Use: Not on file    No family history on file.   Review of Systems  Constitutional: Negative.   HENT: Negative.   Eyes: Negative.   Respiratory: Negative.   Cardiovascular: Negative.   Gastrointestinal: Negative.   Genitourinary: Negative.   Musculoskeletal: Positive for joint pain.  Skin: Negative.   Neurological: Negative.   Endo/Heme/Allergies: Negative.   Psychiatric/Behavioral: Negative.     Objective:  Physical Exam  Constitutional: He is oriented to person, place, and time. He appears well-developed and well-nourished.  HENT:  Head: Normocephalic and atraumatic.  Eyes: EOM are normal. Pupils are equal, round, and reactive to light.  Neck: Normal range of motion. Neck supple.  Cardiovascular: Normal rate and regular rhythm.   Respiratory: Effort normal and breath sounds normal. No respiratory  distress. He has no wheezes. He has no rales.  GI: Soft. Bowel sounds are normal.  Musculoskeletal:  Examination of his left knee reveals range of motion from 0-120 degrees.  Marked patellofemoral crepitus.  Medial joint line tenderness.  Nothing laterally.    Neurological: He is alert and oriented to person, place, and time.  Skin: Skin is warm and dry.  Psychiatric: He has a normal mood and affect. His behavior is normal. Judgment and thought content normal.    Vital signs in last 24 hours: @VSRANGES @  Labs:   There is no height or weight on file to calculate BMI.   Imaging Review Plain radiographs demonstrate severe degenerative joint disease of the left knee(s). The overall alignment isneutral. The bone quality appears to be fair for age and reported activity level.  Assessment/Plan:  End stage arthritis, left knee   The patient history, physical examination, clinical judgment of the provider and imaging studies are consistent with end stage degenerative joint disease of the left knee(s) and total knee arthroplasty is deemed medically necessary. The treatment options including medical management, injection therapy arthroscopy and arthroplasty were discussed at length. The risks and benefits of total knee arthroplasty were presented and reviewed. The risks due to aseptic loosening, infection, stiffness, patella tracking problems, thromboembolic complications and other imponderables were discussed. The patient acknowledged the explanation, agreed to proceed with the plan and consent was signed. Patient is being admitted for inpatient treatment for surgery, pain control, PT, OT, prophylactic antibiotics, VTE prophylaxis, progressive ambulation and ADL's and discharge planning. The patient is planning to be discharged home with  home health services

## 2014-11-15 ENCOUNTER — Encounter (HOSPITAL_COMMUNITY): Payer: Self-pay

## 2014-11-15 ENCOUNTER — Encounter (HOSPITAL_COMMUNITY)
Admission: RE | Admit: 2014-11-15 | Discharge: 2014-11-15 | Disposition: A | Discharge status: Home / Self Care | Payer: BLUE CROSS/BLUE SHIELD | Source: Ambulatory Visit | Attending: Orthopedic Surgery | Admitting: Orthopedic Surgery

## 2014-11-15 DIAGNOSIS — Z01818 Encounter for other preprocedural examination: Secondary | ICD-10-CM | POA: Diagnosis present

## 2014-11-15 DIAGNOSIS — Z96651 Presence of right artificial knee joint: Secondary | ICD-10-CM | POA: Diagnosis not present

## 2014-11-15 DIAGNOSIS — Z0183 Encounter for blood typing: Secondary | ICD-10-CM | POA: Insufficient documentation

## 2014-11-15 DIAGNOSIS — Z79899 Other long term (current) drug therapy: Secondary | ICD-10-CM | POA: Diagnosis not present

## 2014-11-15 DIAGNOSIS — K219 Gastro-esophageal reflux disease without esophagitis: Secondary | ICD-10-CM | POA: Diagnosis not present

## 2014-11-15 DIAGNOSIS — R9431 Abnormal electrocardiogram [ECG] [EKG]: Secondary | ICD-10-CM | POA: Diagnosis not present

## 2014-11-15 DIAGNOSIS — Z85820 Personal history of malignant melanoma of skin: Secondary | ICD-10-CM | POA: Diagnosis not present

## 2014-11-15 DIAGNOSIS — I10 Essential (primary) hypertension: Secondary | ICD-10-CM | POA: Diagnosis not present

## 2014-11-15 DIAGNOSIS — Z7982 Long term (current) use of aspirin: Secondary | ICD-10-CM | POA: Diagnosis not present

## 2014-11-15 DIAGNOSIS — M179 Osteoarthritis of knee, unspecified: Secondary | ICD-10-CM | POA: Insufficient documentation

## 2014-11-15 DIAGNOSIS — Z01812 Encounter for preprocedural laboratory examination: Secondary | ICD-10-CM | POA: Diagnosis not present

## 2014-11-15 HISTORY — DX: Gastro-esophageal reflux disease without esophagitis: K21.9

## 2014-11-15 HISTORY — DX: Unspecified osteoarthritis, unspecified site: M19.90

## 2014-11-15 HISTORY — DX: Other complications of anesthesia, initial encounter: T88.59XA

## 2014-11-15 HISTORY — DX: Adverse effect of unspecified anesthetic, initial encounter: T41.45XA

## 2014-11-15 HISTORY — DX: Essential (primary) hypertension: I10

## 2014-11-15 HISTORY — DX: Malignant (primary) neoplasm, unspecified: C80.1

## 2014-11-15 LAB — APTT: APTT: 25 s (ref 24–37)

## 2014-11-15 LAB — CBC WITH DIFFERENTIAL/PLATELET
Basophils Absolute: 0 10*3/uL (ref 0.0–0.1)
Basophils Relative: 0 % (ref 0–1)
EOS PCT: 4 % (ref 0–5)
Eosinophils Absolute: 0.3 10*3/uL (ref 0.0–0.7)
HCT: 41 % (ref 39.0–52.0)
Hemoglobin: 14.1 g/dL (ref 13.0–17.0)
LYMPHS PCT: 31 % (ref 12–46)
Lymphs Abs: 2.2 10*3/uL (ref 0.7–4.0)
MCH: 32.8 pg (ref 26.0–34.0)
MCHC: 34.4 g/dL (ref 30.0–36.0)
MCV: 95.3 fL (ref 78.0–100.0)
MONO ABS: 0.4 10*3/uL (ref 0.1–1.0)
MONOS PCT: 6 % (ref 3–12)
NEUTROS ABS: 4.1 10*3/uL (ref 1.7–7.7)
NEUTROS PCT: 59 % (ref 43–77)
PLATELETS: 244 10*3/uL (ref 150–400)
RBC: 4.3 MIL/uL (ref 4.22–5.81)
RDW: 13.2 % (ref 11.5–15.5)
WBC: 7.1 10*3/uL (ref 4.0–10.5)

## 2014-11-15 LAB — COMPREHENSIVE METABOLIC PANEL
ALT: 20 U/L (ref 17–63)
ANION GAP: 6 (ref 5–15)
AST: 19 U/L (ref 15–41)
Albumin: 3.8 g/dL (ref 3.5–5.0)
Alkaline Phosphatase: 38 U/L (ref 38–126)
BILIRUBIN TOTAL: 0.5 mg/dL (ref 0.3–1.2)
BUN: 16 mg/dL (ref 6–20)
CALCIUM: 9.6 mg/dL (ref 8.9–10.3)
CO2: 29 mmol/L (ref 22–32)
CREATININE: 1.01 mg/dL (ref 0.61–1.24)
Chloride: 103 mmol/L (ref 101–111)
GFR calc Af Amer: >60 mL/min (ref >60)
Glucose, Bld: 145 mg/dL — ABNORMAL HIGH (ref 65–99)
POTASSIUM: 4 mmol/L (ref 3.5–5.1)
Sodium: 138 mmol/L (ref 135–145)
Total Protein: 6.6 g/dL (ref 6.5–8.1)

## 2014-11-15 LAB — TYPE AND SCREEN
ABO/RH(D): A POS
Antibody Screen: NEGATIVE

## 2014-11-15 LAB — PROTIME-INR
INR: 0.96 (ref 0.00–1.49)
Prothrombin Time: 13 seconds (ref 11.6–15.2)

## 2014-11-15 LAB — SURGICAL PCR SCREEN
MRSA, PCR: NEGATIVE
Staphylococcus aureus: POSITIVE — AB

## 2014-11-15 NOTE — Progress Notes (Signed)
Message left for patient regarding PCR results ,Rx called to [facility] on [address].

## 2014-11-15 NOTE — Progress Notes (Signed)
This patient tested at an elevated risk for obstructive sleep apnea during a pre-surgical visit using the STOP BANG TOOL. A score of 4 or greater is considered an elevated risk.

## 2014-11-15 NOTE — Pre-Procedure Instructions (Signed)
    [name]  11/15/2014      [id number] - [place], [address] [address] Phone: [phone] Fax: [phone]    Your procedure is scheduled on 11-27-2014   Wednesday    Report to [facility] Admitting at 6:30 A.M.   Call this number if you have problems the morning of surgery:  [phone]   Remember:  Do not eat food or drink liquids after midnight.   Take these medicines the morning of surgery with A SIP OF WATER Pain medication if needed   Do not wear jewelry,  Do not wear lotions, powders, or perfumes.    Do not shave 48 hours prior to surgery.  Men may shave face and neck.\   Do not bring valuables to the hospital.  [facility] is not responsible for any belongings or valuables.  Contacts, dentures or bridgework may not be worn into surgery.  Leave your suitcase in the car.  After surgery it may be brought to your room.  For patients admitted to the hospital, discharge time will be determined by your treatment team.  Patients discharged the day of surgery will not be allowed to drive home.   Special instructions:  See attached sheet "Preparing for Surgery" for instructions on CHG  Shower/bath    Please read over the following fact sheets that you were given. Pain Booklet, Coughing and Deep Breathing, Blood Transfusion Information and Surgical Site Infection Prevention

## 2014-11-16 LAB — URINE CULTURE: CULTURE: NO GROWTH

## 2014-11-18 ENCOUNTER — Encounter (HOSPITAL_COMMUNITY): Payer: Self-pay

## 2014-11-18 NOTE — Progress Notes (Signed)
Anesthesia Chart Review: Patient is a 59 year old male scheduled for left TKR on 11/27/14 by Dr. Kathryne Hitch.  History includes non-smoker, HTN, arthritis, GERD, melanoma (back) excision, right TKR, L4-5 laminotomies '07, knee arthroscopies, carpal tunnel release. For anesthesia history, he reported waking up during one of his surgeries. (He has had multiple surgeries at [facility], some under GA and others with MAC.)  BMI is consistent with morbid obesity. OSA screening score was 4 or greater.  PCP is listed as Dr. Juanita Craver.  He is not followed by a cardiologist but was seen by Dr. Domenic Polite back in 2008 for evaluation of palpitations with associated chest tightness. Holter showed occasional PVCs but no sustained arrhythmias. He also had an unremarkable stress and echo and PRN follow-up was recommended.  Meds include ASA 81 mg, Norco, Krill oil, Benicar HCT, Vitamin E.  11/15/14 EKG: NSR, cannot rule out anterior infarct (age undetermined). Reverse r wave progression in V2-3 is new since 02/03/11. No CV symptoms were documented from his PAT visit.   05/20/06 Echo: Overall left ventricular systolic function was normal. Left ventricular ejection fraction was estimated to be 60 %. There was no diagnostic evidence of left ventricular regional wall motion abnormalities.  According to 05/26/06 cardiology notes, "... He underwent an adenosine Myoview [in 04/2006] which revealed no evidence of ischemia or scar with a normalejection fraction of 60%."  Preoperative labs noted.   If patient remains asymptomatic from a CV standpoint then I would anticipate that he could proceed as planned.   [facility] Short Stay Center/Anesthesiology Phone [phone] 11/18/2014 6:57 PM

## 2014-11-26 MED ORDER — DEXTROSE 5 % IV SOLN
3.0000 g | INTRAVENOUS | Status: AC
  Administered 2014-11-27: 3 g via INTRAVENOUS
  Filled 2014-11-26: qty 3000

## 2014-11-27 ENCOUNTER — Inpatient Hospital Stay (HOSPITAL_COMMUNITY): Payer: BLUE CROSS/BLUE SHIELD | Admitting: Certified Registered Nurse Anesthetist

## 2014-11-27 ENCOUNTER — Encounter (HOSPITAL_COMMUNITY): Payer: Self-pay | Admitting: *Deleted

## 2014-11-27 ENCOUNTER — Encounter (HOSPITAL_COMMUNITY)
Admission: RE | Disposition: A | Discharge status: Home Health | Payer: Self-pay | Source: Ambulatory Visit | Attending: Orthopedic Surgery

## 2014-11-27 ENCOUNTER — Inpatient Hospital Stay (HOSPITAL_COMMUNITY): Payer: BLUE CROSS/BLUE SHIELD

## 2014-11-27 ENCOUNTER — Inpatient Hospital Stay (HOSPITAL_COMMUNITY)
Admission: RE | Admit: 2014-11-27 | Discharge: 2014-11-29 | DRG: 470 | Disposition: A | Discharge status: Home Health | Payer: BLUE CROSS/BLUE SHIELD | Source: Ambulatory Visit | Attending: Orthopedic Surgery | Admitting: Orthopedic Surgery

## 2014-11-27 ENCOUNTER — Inpatient Hospital Stay (HOSPITAL_COMMUNITY): Payer: BLUE CROSS/BLUE SHIELD | Admitting: Vascular Surgery

## 2014-11-27 DIAGNOSIS — Y92230 Patient room in hospital as the place of occurrence of the external cause: Secondary | ICD-10-CM | POA: Diagnosis not present

## 2014-11-27 DIAGNOSIS — Z6841 Body Mass Index (BMI) 40.0 and over, adult: Secondary | ICD-10-CM | POA: Diagnosis not present

## 2014-11-27 DIAGNOSIS — F419 Anxiety disorder, unspecified: Secondary | ICD-10-CM | POA: Diagnosis present

## 2014-11-27 DIAGNOSIS — I10 Essential (primary) hypertension: Secondary | ICD-10-CM | POA: Diagnosis present

## 2014-11-27 DIAGNOSIS — Z96659 Presence of unspecified artificial knee joint: Secondary | ICD-10-CM

## 2014-11-27 DIAGNOSIS — R110 Nausea: Secondary | ICD-10-CM | POA: Diagnosis not present

## 2014-11-27 DIAGNOSIS — T402X5A Adverse effect of other opioids, initial encounter: Secondary | ICD-10-CM | POA: Diagnosis not present

## 2014-11-27 DIAGNOSIS — M25562 Pain in left knee: Secondary | ICD-10-CM | POA: Diagnosis present

## 2014-11-27 DIAGNOSIS — K219 Gastro-esophageal reflux disease without esophagitis: Secondary | ICD-10-CM | POA: Diagnosis present

## 2014-11-27 DIAGNOSIS — M1712 Unilateral primary osteoarthritis, left knee: Principal | ICD-10-CM | POA: Diagnosis present

## 2014-11-27 DIAGNOSIS — M1710 Unilateral primary osteoarthritis, unspecified knee: Secondary | ICD-10-CM | POA: Diagnosis present

## 2014-11-27 DIAGNOSIS — D62 Acute posthemorrhagic anemia: Secondary | ICD-10-CM | POA: Diagnosis not present

## 2014-11-27 DIAGNOSIS — M179 Osteoarthritis of knee, unspecified: Secondary | ICD-10-CM | POA: Diagnosis present

## 2014-11-27 DIAGNOSIS — G4440 Drug-induced headache, not elsewhere classified, not intractable: Secondary | ICD-10-CM | POA: Diagnosis not present

## 2014-11-27 HISTORY — PX: TOTAL KNEE ARTHROPLASTY: SHX125

## 2014-11-27 SURGERY — ARTHROPLASTY, KNEE, TOTAL
Anesthesia: General | Site: Knee | Laterality: Left

## 2014-11-27 MED ORDER — METHOCARBAMOL 500 MG PO TABS
500.0000 mg | ORAL_TABLET | Freq: Four times a day (QID) | ORAL | Status: DC | PRN
  Administered 2014-11-28 – 2014-11-29 (×5): 500 mg via ORAL
  Filled 2014-11-27 (×6): qty 1

## 2014-11-27 MED ORDER — HYDROMORPHONE HCL 1 MG/ML IJ SOLN
0.2500 mg | INTRAMUSCULAR | Status: DC | PRN
  Administered 2014-11-27 (×4): 0.5 mg via INTRAVENOUS

## 2014-11-27 MED ORDER — METHOCARBAMOL 500 MG PO TABS: 500.0000 mg | ORAL_TABLET | Freq: Four times a day (QID) | ORAL | Status: AC

## 2014-11-27 MED ORDER — BISACODYL 10 MG RE SUPP: 10.0000 mg | Freq: Every day | RECTAL | Status: DC | PRN

## 2014-11-27 MED ORDER — MAGNESIUM CITRATE PO SOLN: 1.0000 | Freq: Once | ORAL | Status: AC | PRN

## 2014-11-27 MED ORDER — ONDANSETRON HCL 4 MG PO TABS: 4.0000 mg | ORAL_TABLET | Freq: Three times a day (TID) | ORAL | Status: AC | PRN

## 2014-11-27 MED ORDER — ONDANSETRON HCL 4 MG/2ML IJ SOLN: 4.0000 mg | Freq: Four times a day (QID) | INTRAMUSCULAR | Status: DC | PRN

## 2014-11-27 MED ORDER — OXYCODONE-ACETAMINOPHEN 5-325 MG PO TABS: 1.0000 | ORAL_TABLET | ORAL | Status: AC | PRN

## 2014-11-27 MED ORDER — FUROSEMIDE 10 MG/ML IJ SOLN
40.0000 mg | Freq: Once | INTRAMUSCULAR | Status: DC
  Filled 2014-11-27 (×2): qty 4

## 2014-11-27 MED ORDER — MIDAZOLAM HCL 5 MG/5ML IJ SOLN
INTRAMUSCULAR | Status: DC | PRN
  Administered 2014-11-27: 2 mg via INTRAVENOUS

## 2014-11-27 MED ORDER — BISACODYL 5 MG PO TBEC: 5.0000 mg | DELAYED_RELEASE_TABLET | Freq: Every day | ORAL | Status: AC | PRN

## 2014-11-27 MED ORDER — DOCUSATE SODIUM 100 MG PO CAPS
100.0000 mg | ORAL_CAPSULE | Freq: Two times a day (BID) | ORAL | Status: DC
  Administered 2014-11-27 – 2014-11-29 (×4): 100 mg via ORAL
  Filled 2014-11-27 (×4): qty 1

## 2014-11-27 MED ORDER — METHOCARBAMOL 1000 MG/10ML IJ SOLN
500.0000 mg | INTRAVENOUS | Status: AC
  Administered 2014-11-27: 500 mg via INTRAVENOUS
  Filled 2014-11-27: qty 5

## 2014-11-27 MED ORDER — CELECOXIB 200 MG PO CAPS
200.0000 mg | ORAL_CAPSULE | Freq: Two times a day (BID) | ORAL | Status: DC
  Administered 2014-11-27 – 2014-11-29 (×4): 200 mg via ORAL
  Filled 2014-11-27 (×4): qty 1

## 2014-11-27 MED ORDER — METHOCARBAMOL 1000 MG/10ML IJ SOLN
500.0000 mg | Freq: Four times a day (QID) | INTRAVENOUS | Status: DC | PRN
  Administered 2014-11-27: 500 mg via INTRAVENOUS
  Filled 2014-11-27 (×4): qty 5

## 2014-11-27 MED ORDER — FENTANYL CITRATE (PF) 250 MCG/5ML IJ SOLN
INTRAMUSCULAR | Status: AC
  Filled 2014-11-27: qty 5

## 2014-11-27 MED ORDER — PROPOFOL 10 MG/ML IV BOLUS
INTRAVENOUS | Status: AC
  Filled 2014-11-27: qty 20

## 2014-11-27 MED ORDER — POTASSIUM CHLORIDE IN NACL 20-0.9 MEQ/L-% IV SOLN
INTRAVENOUS | Status: DC
  Administered 2014-11-27 – 2014-11-28 (×2): via INTRAVENOUS
  Filled 2014-11-27 (×2): qty 1000

## 2014-11-27 MED ORDER — PHENOL 1.4 % MT LIQD: 1.0000 | OROMUCOSAL | Status: DC | PRN

## 2014-11-27 MED ORDER — MENTHOL 3 MG MT LOZG: 1.0000 | LOZENGE | OROMUCOSAL | Status: DC | PRN

## 2014-11-27 MED ORDER — OXYCODONE HCL 5 MG PO TABS
ORAL_TABLET | ORAL | Status: AC
  Administered 2014-11-27: 10 mg via ORAL
  Filled 2014-11-27: qty 1

## 2014-11-27 MED ORDER — APIXABAN 2.5 MG PO TABS
2.5000 mg | ORAL_TABLET | Freq: Two times a day (BID) | ORAL | Status: DC
  Administered 2014-11-28 – 2014-11-29 (×3): 2.5 mg via ORAL
  Filled 2014-11-27 (×3): qty 1

## 2014-11-27 MED ORDER — CHLORHEXIDINE GLUCONATE 4 % EX LIQD: 60.0000 mL | Freq: Once | CUTANEOUS | Status: DC

## 2014-11-27 MED ORDER — CEFAZOLIN SODIUM 1-5 GM-% IV SOLN
1.0000 g | Freq: Four times a day (QID) | INTRAVENOUS | Status: AC
  Administered 2014-11-27 (×2): 1 g via INTRAVENOUS
  Filled 2014-11-27 (×2): qty 50

## 2014-11-27 MED ORDER — FENTANYL CITRATE (PF) 100 MCG/2ML IJ SOLN
INTRAMUSCULAR | Status: DC | PRN
  Administered 2014-11-27 (×3): 50 ug via INTRAVENOUS
  Administered 2014-11-27: 100 ug via INTRAVENOUS
  Administered 2014-11-27 (×3): 50 ug via INTRAVENOUS

## 2014-11-27 MED ORDER — METOCLOPRAMIDE HCL 5 MG/ML IJ SOLN: 5.0000 mg | Freq: Three times a day (TID) | INTRAMUSCULAR | Status: DC | PRN

## 2014-11-27 MED ORDER — HYDROMORPHONE HCL 1 MG/ML IJ SOLN
INTRAMUSCULAR | Status: AC
  Administered 2014-11-27: 1 mg via INTRAVENOUS
  Filled 2014-11-27: qty 1

## 2014-11-27 MED ORDER — ACETAMINOPHEN 325 MG PO TABS
650.0000 mg | ORAL_TABLET | Freq: Four times a day (QID) | ORAL | Status: DC | PRN
  Administered 2014-11-27 – 2014-11-29 (×2): 650 mg via ORAL
  Filled 2014-11-27 (×2): qty 2

## 2014-11-27 MED ORDER — PROPOFOL 10 MG/ML IV BOLUS
INTRAVENOUS | Status: DC | PRN
  Administered 2014-11-27: 230 mg via INTRAVENOUS
  Administered 2014-11-27 (×2): 20 mg via INTRAVENOUS

## 2014-11-27 MED ORDER — DIPHENHYDRAMINE HCL 12.5 MG/5ML PO ELIX: 12.5000 mg | ORAL_SOLUTION | ORAL | Status: DC | PRN

## 2014-11-27 MED ORDER — IRBESARTAN 150 MG PO TABS
150.0000 mg | ORAL_TABLET | Freq: Every day | ORAL | Status: DC
  Administered 2014-11-27 – 2014-11-29 (×3): 150 mg via ORAL
  Filled 2014-11-27 (×3): qty 1

## 2014-11-27 MED ORDER — SODIUM CHLORIDE 0.9 % IV SOLN
INTRAVENOUS | Status: DC | PRN
  Administered 2014-11-27: 60 mL

## 2014-11-27 MED ORDER — ONDANSETRON HCL 4 MG/2ML IJ SOLN
4.0000 mg | Freq: Four times a day (QID) | INTRAMUSCULAR | Status: DC | PRN
  Administered 2014-11-27 – 2014-11-28 (×2): 4 mg via INTRAVENOUS
  Filled 2014-11-27 (×3): qty 2

## 2014-11-27 MED ORDER — LACTATED RINGERS IV SOLN
INTRAVENOUS | Status: DC | PRN
  Administered 2014-11-27 (×2): via INTRAVENOUS

## 2014-11-27 MED ORDER — METOCLOPRAMIDE HCL 5 MG PO TABS: 5.0000 mg | ORAL_TABLET | Freq: Three times a day (TID) | ORAL | Status: DC | PRN

## 2014-11-27 MED ORDER — DEXAMETHASONE SODIUM PHOSPHATE 10 MG/ML IJ SOLN
10.0000 mg | Freq: Once | INTRAMUSCULAR | Status: AC
  Administered 2014-11-28: 10 mg via INTRAVENOUS
  Filled 2014-11-27: qty 1

## 2014-11-27 MED ORDER — APIXABAN 2.5 MG PO TABS: ORAL_TABLET | ORAL | Status: DC

## 2014-11-27 MED ORDER — NEOSTIGMINE METHYLSULFATE 10 MG/10ML IV SOLN
INTRAVENOUS | Status: DC | PRN
  Administered 2014-11-27: 4 mg via INTRAVENOUS

## 2014-11-27 MED ORDER — POLYETHYLENE GLYCOL 3350 17 G PO PACK
17.0000 g | PACK | Freq: Every day | ORAL | Status: DC | PRN
Start: 2014-11-27 — End: 2014-11-29

## 2014-11-27 MED ORDER — ALUM & MAG HYDROXIDE-SIMETH 200-200-20 MG/5ML PO SUSP: 30.0000 mL | ORAL | Status: DC | PRN

## 2014-11-27 MED ORDER — ONDANSETRON HCL 4 MG PO TABS: 4.0000 mg | ORAL_TABLET | Freq: Four times a day (QID) | ORAL | Status: DC | PRN

## 2014-11-27 MED ORDER — OXYCODONE HCL 5 MG PO TABS
5.0000 mg | ORAL_TABLET | ORAL | Status: DC | PRN
  Administered 2014-11-27 – 2014-11-29 (×10): 10 mg via ORAL
  Filled 2014-11-27 (×10): qty 2

## 2014-11-27 MED ORDER — BUPIVACAINE LIPOSOME 1.3 % IJ SUSP
20.0000 mL | INTRAMUSCULAR | Status: DC
  Filled 2014-11-27: qty 20

## 2014-11-27 MED ORDER — BUPIVACAINE-EPINEPHRINE (PF) 0.5% -1:200000 IJ SOLN
INTRAMUSCULAR | Status: AC
  Filled 2014-11-27: qty 30

## 2014-11-27 MED ORDER — SUCCINYLCHOLINE CHLORIDE 20 MG/ML IJ SOLN
INTRAMUSCULAR | Status: DC | PRN
  Administered 2014-11-27: 140 mg via INTRAVENOUS

## 2014-11-27 MED ORDER — OXYCODONE HCL 5 MG/5ML PO SOLN: 5.0000 mg | Freq: Once | ORAL | Status: AC | PRN

## 2014-11-27 MED ORDER — HYDROCHLOROTHIAZIDE 12.5 MG PO CAPS
12.5000 mg | ORAL_CAPSULE | Freq: Every day | ORAL | Status: DC
  Administered 2014-11-28 – 2014-11-29 (×2): 12.5 mg via ORAL
  Filled 2014-11-27 (×2): qty 1

## 2014-11-27 MED ORDER — LIDOCAINE HCL (CARDIAC) 20 MG/ML IV SOLN
INTRAVENOUS | Status: DC | PRN
  Administered 2014-11-27: 80 mg via INTRAVENOUS

## 2014-11-27 MED ORDER — PHENYLEPHRINE HCL 10 MG/ML IJ SOLN
INTRAMUSCULAR | Status: DC | PRN
  Administered 2014-11-27 (×2): 80 ug via INTRAVENOUS
  Administered 2014-11-27: 40 ug via INTRAVENOUS
  Administered 2014-11-27 (×7): 80 ug via INTRAVENOUS

## 2014-11-27 MED ORDER — ONDANSETRON HCL 4 MG/2ML IJ SOLN
INTRAMUSCULAR | Status: DC | PRN
  Administered 2014-11-27 (×2): 4 mg via INTRAVENOUS

## 2014-11-27 MED ORDER — SODIUM CHLORIDE 0.9 % IR SOLN
Status: DC | PRN
  Administered 2014-11-27: 3000 mL

## 2014-11-27 MED ORDER — HYDROMORPHONE HCL 1 MG/ML IJ SOLN
0.5000 mg | INTRAMUSCULAR | Status: DC | PRN
  Administered 2014-11-27 (×4): 1 mg via INTRAVENOUS
  Filled 2014-11-27 (×3): qty 1

## 2014-11-27 MED ORDER — BUPIVACAINE HCL 0.5 % IJ SOLN
INTRAMUSCULAR | Status: DC | PRN
  Administered 2014-11-27: 10 mL

## 2014-11-27 MED ORDER — ROCURONIUM BROMIDE 100 MG/10ML IV SOLN
INTRAVENOUS | Status: DC | PRN
  Administered 2014-11-27: 30 mg via INTRAVENOUS

## 2014-11-27 MED ORDER — OLMESARTAN MEDOXOMIL-HCTZ 20-12.5 MG PO TABS: 1.0000 | ORAL_TABLET | Freq: Every day | ORAL | Status: DC

## 2014-11-27 MED ORDER — GLYCOPYRROLATE 0.2 MG/ML IJ SOLN
INTRAMUSCULAR | Status: DC | PRN
  Administered 2014-11-27: .6 mg via INTRAVENOUS

## 2014-11-27 MED ORDER — ACETAMINOPHEN 650 MG RE SUPP: 650.0000 mg | Freq: Four times a day (QID) | RECTAL | Status: DC | PRN

## 2014-11-27 MED ORDER — LACTATED RINGERS IV SOLN: INTRAVENOUS | Status: DC

## 2014-11-27 MED ORDER — OXYCODONE HCL 5 MG PO TABS
5.0000 mg | ORAL_TABLET | Freq: Once | ORAL | Status: AC | PRN
  Administered 2014-11-27: 5 mg via ORAL

## 2014-11-27 MED ORDER — MIDAZOLAM HCL 2 MG/2ML IJ SOLN
INTRAMUSCULAR | Status: AC
  Filled 2014-11-27: qty 4

## 2014-11-27 SURGICAL SUPPLY — 66 items
BANDAGE ELASTIC 4 VELCRO ST LF (GAUZE/BANDAGES/DRESSINGS) ×3 IMPLANT
BANDAGE ELASTIC 6 VELCRO ST LF (GAUZE/BANDAGES/DRESSINGS) ×3 IMPLANT
BANDAGE ESMARK 6X9 LF (GAUZE/BANDAGES/DRESSINGS) ×1 IMPLANT
BENZOIN TINCTURE PRP APPL 2/3 (GAUZE/BANDAGES/DRESSINGS) ×3 IMPLANT
BLADE SAG 18X100X1.27 (BLADE) ×6 IMPLANT
BNDG ESMARK 6X9 LF (GAUZE/BANDAGES/DRESSINGS) ×3
BOWL SMART MIX CTS (DISPOSABLE) ×3 IMPLANT
CAPT KNEE TOTAL 3 ×3 IMPLANT
CEMENT BONE SIMPLEX SPEEDSET (Cement) ×6 IMPLANT
CLOSURE STERI-STRIP 1/2X4 (GAUZE/BANDAGES/DRESSINGS) ×1
CLOSURE WOUND 1/2 X4 (GAUZE/BANDAGES/DRESSINGS) ×2
CLSR STERI-STRIP ANTIMIC 1/2X4 (GAUZE/BANDAGES/DRESSINGS) ×2 IMPLANT
COVER SURGICAL LIGHT HANDLE (MISCELLANEOUS) ×3 IMPLANT
CUFF TOURNIQUET SINGLE 34IN LL (TOURNIQUET CUFF) ×3 IMPLANT
DRAPE EXTREMITY T 121X128X90 (DRAPE) ×3 IMPLANT
DRAPE IMP U-DRAPE 54X76 (DRAPES) ×3 IMPLANT
DRAPE PROXIMA HALF (DRAPES) ×3 IMPLANT
DRAPE U-SHAPE 47X51 STRL (DRAPES) ×3 IMPLANT
DRSG PAD ABDOMINAL 8X10 ST (GAUZE/BANDAGES/DRESSINGS) ×3 IMPLANT
DURAPREP 26ML APPLICATOR (WOUND CARE) ×6 IMPLANT
ELECT CAUTERY BLADE 6.4 (BLADE) ×3 IMPLANT
ELECT REM PT RETURN 9FT ADLT (ELECTROSURGICAL) ×3
ELECTRODE REM PT RTRN 9FT ADLT (ELECTROSURGICAL) ×1 IMPLANT
EVACUATOR 1/8 PVC DRAIN (DRAIN) ×3 IMPLANT
FACESHIELD WRAPAROUND (MASK) ×6 IMPLANT
GAUZE SPONGE 4X4 12PLY STRL (GAUZE/BANDAGES/DRESSINGS) ×3 IMPLANT
GLOVE BIOGEL PI IND STRL 7.0 (GLOVE) ×1 IMPLANT
GLOVE BIOGEL PI INDICATOR 7.0 (GLOVE) ×2
GLOVE ORTHO TXT STRL SZ7.5 (GLOVE) ×3 IMPLANT
GLOVE SURG ORTHO 7.0 STRL STRW (GLOVE) ×3 IMPLANT
GOWN STRL REUS W/ TWL LRG LVL3 (GOWN DISPOSABLE) ×2 IMPLANT
GOWN STRL REUS W/ TWL XL LVL3 (GOWN DISPOSABLE) ×1 IMPLANT
GOWN STRL REUS W/TWL LRG LVL3 (GOWN DISPOSABLE) ×4
GOWN STRL REUS W/TWL XL LVL3 (GOWN DISPOSABLE) ×2
HANDPIECE INTERPULSE COAX TIP (DISPOSABLE) ×2
IMMOBILIZER KNEE 22 UNIV (SOFTGOODS) ×3 IMPLANT
IMMOBILIZER KNEE 24 THIGH 36 (MISCELLANEOUS) IMPLANT
IMMOBILIZER KNEE 24 UNIV (MISCELLANEOUS)
KIT BASIN OR (CUSTOM PROCEDURE TRAY) ×3 IMPLANT
KIT ROOM TURNOVER OR (KITS) ×3 IMPLANT
MANIFOLD NEPTUNE II (INSTRUMENTS) ×3 IMPLANT
NEEDLE 18GX1X1/2 (RX/OR ONLY) (NEEDLE) ×3 IMPLANT
NEEDLE HYPO 25GX1X1/2 BEV (NEEDLE) ×3 IMPLANT
NS IRRIG 1000ML POUR BTL (IV SOLUTION) ×3 IMPLANT
PACK TOTAL JOINT (CUSTOM PROCEDURE TRAY) ×3 IMPLANT
PACK UNIVERSAL I (CUSTOM PROCEDURE TRAY) ×3 IMPLANT
PAD ARMBOARD 7.5X6 YLW CONV (MISCELLANEOUS) ×6 IMPLANT
PAD CAST 4YDX4 CTTN HI CHSV (CAST SUPPLIES) ×1 IMPLANT
PADDING CAST COTTON 4X4 STRL (CAST SUPPLIES) ×2
PADDING CAST COTTON 6X4 STRL (CAST SUPPLIES) ×3 IMPLANT
SET HNDPC FAN SPRY TIP SCT (DISPOSABLE) ×1 IMPLANT
SPONGE GAUZE 4X4 12PLY STER LF (GAUZE/BANDAGES/DRESSINGS) ×3 IMPLANT
STRIP CLOSURE SKIN 1/2X4 (GAUZE/BANDAGES/DRESSINGS) ×4 IMPLANT
SUCTION FRAZIER TIP 10 FR DISP (SUCTIONS) ×3 IMPLANT
SUT MNCRL AB 4-0 PS2 18 (SUTURE) ×3 IMPLANT
SUT VIC AB 0 CT1 27 (SUTURE)
SUT VIC AB 0 CT1 27XBRD ANBCTR (SUTURE) IMPLANT
SUT VIC AB 1 CTX 36 (SUTURE) ×2
SUT VIC AB 1 CTX36XBRD ANBCTR (SUTURE) ×1 IMPLANT
SUT VIC AB 2-0 CT1 27 (SUTURE) ×4
SUT VIC AB 2-0 CT1 TAPERPNT 27 (SUTURE) ×2 IMPLANT
SYR 50ML LL SCALE MARK (SYRINGE) ×3 IMPLANT
SYR CONTROL 10ML LL (SYRINGE) ×3 IMPLANT
TOWEL OR 17X24 6PK STRL BLUE (TOWEL DISPOSABLE) ×3 IMPLANT
TOWEL OR 17X26 10 PK STRL BLUE (TOWEL DISPOSABLE) ×3 IMPLANT
WATER STERILE IRR 1000ML POUR (IV SOLUTION) ×3 IMPLANT

## 2014-11-27 NOTE — Anesthesia Procedure Notes (Signed)
Procedure Name: Intubation Date/Time: 11/27/2014 8:51 AM Performed by: Merdis Delay Pre-anesthesia Checklist: Patient identified, Emergency Drugs available, Suction available, Patient being monitored and Timeout performed Patient Re-evaluated:Patient Re-evaluated prior to inductionOxygen Delivery Method: Circle system utilized Preoxygenation: Pre-oxygenation with 100% oxygen Intubation Type: IV induction Ventilation: Mask ventilation without difficulty and Oral airway inserted - appropriate to patient size Laryngoscope Size: Mac and 4 Grade View: Grade I Tube type: Oral Tube size: 8.0 mm Number of attempts: 1 Airway Equipment and Method: Stylet Placement Confirmation: ETT inserted through vocal cords under direct vision,  positive ETCO2,  CO2 detector and breath sounds checked- equal and bilateral Secured at: 23 cm Tube secured with: Tape Dental Injury: Teeth and Oropharynx as per pre-operative assessment

## 2014-11-27 NOTE — Progress Notes (Signed)
Orthopedic Tech Progress Note Patient Details:  [name] 11/11/1955 [id number]  Patient ID: [name], male   DOB: 01-10-1956, 59 y.o.   MRN: [id number] Viewed order from doctor's order list  [name] 11/27/2014, 11:50 AM

## 2014-11-27 NOTE — Evaluation (Signed)
Physical Therapy Evaluation Patient Details Name: [name] MRN: [id number] DOB: 25-Jun-1955 Today's Date: 11/27/2014   History of Present Illness  Pt is a 59 y/o M s/p L TKA.  Pt's PMH includes HTN.  Clinical Impression  Pt is s/p L TKA resulting in the deficits listed below (see PT Problem List). Pt's father in critical condition in hospital and family at pt's bedside discussing this upon PT arrival. However, pt requests PT to be completed.  Pt lethargic but able to perform sit<>stand and stand pivot w/ min assist.   Pt will benefit from skilled PT to increase their independence and safety with mobility to allow discharge to the venue listed below.     Follow Up Recommendations Home health PT;Supervision for mobility/OOB    Equipment Recommendations  None recommended by PT    Recommendations for Other Services OT consult     Precautions / Restrictions Precautions Precautions: Knee;Fall Precaution Booklet Issued: Yes (comment) Precaution Comments: Rewviewed knee precautions Restrictions Weight Bearing Restrictions: Yes LLE Weight Bearing: Weight bearing as tolerated      Mobility  Bed Mobility Overal bed mobility: Modified Independent             General bed mobility comments: Mod I w/ use of bed rails and increased time.  Cues for proper technique and for proper breathing as pt demonstrates valsalva maneuver.  Transfers Overall transfer level: Needs assistance Equipment used: Rolling walker (2 wheeled) Transfers: Sit to/from [facility] Sit to Stand: Min assist;From elevated surface Stand pivot transfers: Min assist       General transfer comment: Min assist to assist pt to power up to standing and to maintain balance and mangaging RW during stand pivot transfer.    Ambulation/Gait                Stairs            Wheelchair Mobility    Modified Rankin (Stroke Patients Only)       Balance Overall balance assessment:  Needs assistance Sitting-balance support: Bilateral upper extremity supported;Feet supported Sitting balance-Leahy Scale: Good     Standing balance support: Bilateral upper extremity supported;During functional activity Standing balance-Leahy Scale: Poor Standing balance comment: Relies heavily on RW for support                             Pertinent Vitals/Pain Pain Assessment: 0-10 Pain Score: 8  Pain Location: L knee Pain Descriptors / Indicators: Aching;Sharp;Throbbing Pain Intervention(s): Limited activity within patient's tolerance;Monitored during session;Repositioned    Home Living Family/patient expects to be discharged to:: Private residence Living Arrangements: Spouse/significant other;Children Available Help at Discharge: Family;Available 24 hours/day (wife and grandaughter) Type of Home: Other(Comment) (Townhome) Home Access: Level entry     Home Layout: One level Home Equipment: Walker - 2 wheels;Crutches;Bedside commode      Prior Function Level of Independence: Independent               Hand Dominance        Extremity/Trunk Assessment   Upper Extremity Assessment: Defer to OT evaluation           Lower Extremity Assessment: LLE deficits/detail   LLE Deficits / Details: weakness and limited ROM s/p L TKA     Communication   Communication: No difficulties  Cognition Arousal/Alertness: Lethargic Behavior During Therapy: WFL for tasks assessed/performed Overall Cognitive Status: Within Functional Limits for tasks assessed  General Comments General comments (skin integrity, edema, etc.): Pt's father is in the hospital in critical care.  Family was at bedside discussing this upon PT arrival; however, pt requested that he have PT.    Exercises Total Joint Exercises Ankle Circles/Pumps: AROM;Both;15 reps;Supine Quad Sets: AROM;Both;10 reps;Supine Heel Slides: AROM;AAROM;Left;5 reps;Supine       Assessment/Plan    PT Assessment Patient needs continued PT services  PT Diagnosis Difficulty walking;Abnormality of gait;Generalized weakness;Acute pain   PT Problem List Decreased strength;Decreased range of motion;Decreased activity tolerance;Decreased balance;Decreased mobility;Decreased coordination;Decreased knowledge of use of DME;Decreased safety awareness;Decreased knowledge of precautions;Decreased skin integrity;Pain;Obesity  PT Treatment Interventions DME instruction;Gait training;Stair training;Functional mobility training;Therapeutic activities;Therapeutic exercise;Balance training;Neuromuscular re-education;Patient/family education;Modalities   PT Goals (Current goals can be found in the Care Plan section) Acute Rehab PT Goals Patient Stated Goal: to see his dad PT Goal Formulation: With patient/family Time For Goal Achievement: 12/04/14 Potential to Achieve Goals: Good    Frequency 7X/week   Barriers to discharge        Co-evaluation               End of Session Equipment Utilized During Treatment: Gait belt Activity Tolerance: Patient limited by pain;Patient limited by lethargy Patient left: in chair;with call bell/phone within reach;with family/visitor present Nurse Communication: Mobility status;Precautions;Weight bearing status         Time: 1439-1510 PT Time Calculation (min) (ACUTE ONLY): 31 min   Charges:   PT Evaluation $Initial PT Evaluation Tier I: 1 Procedure PT Treatments $Therapeutic Exercise: 8-22 mins   PT G Codes:       Joslyn Hy PT, DPT [phone] Pager: [phone] 11/27/2014, 4:11 PM

## 2014-11-27 NOTE — Anesthesia Preprocedure Evaluation (Addendum)
Anesthesia Evaluation  Patient identified by MRN, date of birth, ID band Patient awake    Reviewed: Allergy & Precautions, NPO status , Patient's Chart, lab work & pertinent test results  Airway Mallampati: II       Dental  (+) Dental Advisory Given, Teeth Intact,    Pulmonary neg pulmonary ROS,  breath sounds clear to auscultation        Cardiovascular hypertension, Rhythm:regular Rate:Normal     Neuro/Psych    GI/Hepatic GERD-  ,  Endo/Other  Morbid obesity  Renal/GU      Musculoskeletal  (+) Arthritis -,   Abdominal   Peds  Hematology   Anesthesia Other Findings   Reproductive/Obstetrics                           Anesthesia Physical Anesthesia Plan  ASA: II  Anesthesia Plan: General   Post-op Pain Management:    Induction: Intravenous  Airway Management Planned: Oral ETT  Additional Equipment:   Intra-op Plan:   Post-operative Plan: Extubation in OR  Informed Consent: I have reviewed the patients History and Physical, chart, labs and discussed the procedure including the risks, benefits and alternatives for the proposed anesthesia with the patient or authorized representative who has indicated his/her understanding and acceptance.     Plan Discussed with: CRNA, Anesthesiologist and Surgeon  Anesthesia Plan Comments:         Anesthesia Quick Evaluation

## 2014-11-27 NOTE — Transfer of Care (Signed)
Immediate Anesthesia Transfer of Care Note  Patient: [name]  Procedure(s) Performed: Procedure(s): TOTAL KNEE ARTHROPLASTY (Left)  Patient Location: PACU  Anesthesia Type:General  Level of Consciousness: awake, alert  and oriented  Airway & Oxygen Therapy: Patient Spontanous Breathing and Patient connected to face mask oxygen  Post-op Assessment: Report given to RN and Post -op Vital signs reviewed and stable  Post vital signs: Reviewed and stable  Last Vitals:  Filed Vitals:   11/27/14 0648  BP: 138/58  Pulse: 72  Temp: 36.7 C  Resp: 18    Complications: No apparent anesthesia complications

## 2014-11-27 NOTE — Discharge Instructions (Signed)
INSTRUCTIONS AFTER JOINT REPLACEMENT   o Remove items at home which could result in a fall. This includes throw rugs or furniture in walking pathways o ICE to the affected joint every three hours while awake for 30 minutes at a time, for at least the first 3-5 days, and then as needed for pain and swelling.  Continue to use ice for pain and swelling. You may notice swelling that will progress down to the foot and ankle.  This is normal after surgery.  Elevate your leg when you are not up walking on it.   o Continue to use the breathing machine you got in the hospital (incentive spirometer) which will help keep your temperature down.  It is common for your temperature to cycle up and down following surgery, especially at night when you are not up moving around and exerting yourself.  The breathing machine keeps your lungs expanded and your temperature down.  BLOOD THINNER: TAKE ELIQUIS AS PRESCRIBED FOR A TOTAL OF 14 DAYS FOLLOWING SURGERY.  ONCE FINISHED WITH ELIQUIS, TAKE ASPIRIN 325 MG TABLET ONCE DAILY FOR THE NEXT 14 DAYS.  THESE MEDICINES ARE USED TO PREVENT BLOOD CLOTS.  DIET:  As you were doing prior to hospitalization, we recommend a well-balanced diet.  DRESSING / WOUND CARE / SHOWERING  You may change your dressing 3-5 days after surgery.  Then change the dressing every day with sterile gauze.  Please use good hand washing techniques before changing the dressing.  Do not use any lotions or creams on the incision until instructed by your surgeon. and You may shower 3 days after surgery, but keep the wounds dry during showering.  You may use an occlusive plastic wrap (Press'n Seal for example), NO SOAKING/SUBMERGING IN THE BATHTUB.  If the bandage gets wet, change with a clean dry gauze.  If the incision gets wet, pat the wound dry with a clean towel.  ACTIVITY  o Increase activity slowly as tolerated, but follow the weight bearing instructions below.   o No driving for 6 weeks or until  further direction given by your physician.  You cannot drive while taking narcotics.  o No lifting or carrying greater than 10 lbs. until further directed by your surgeon. o Avoid periods of inactivity such as sitting longer than an hour when not asleep. This helps prevent blood clots.  o You may return to work once you are authorized by your doctor.     WEIGHT BEARING   Weight bearing as tolerated with assist device (walker, cane, etc) as directed, use it as long as suggested by your surgeon or therapist, typically at least 4-6 weeks.   EXERCISES  Results after joint replacement surgery are often greatly improved when you follow the exercise, range of motion and muscle strengthening exercises prescribed by your doctor. Safety measures are also important to protect the joint from further injury. Any time any of these exercises cause you to have increased pain or swelling, decrease what you are doing until you are comfortable again and then slowly increase them. If you have problems or questions, call your caregiver or physical therapist for advice.   Rehabilitation is important following a joint replacement. After just a few days of immobilization, the muscles of the leg can become weakened and shrink (atrophy).  These exercises are designed to build up the tone and strength of the thigh and leg muscles and to improve motion. Often times heat used for twenty to thirty minutes before working out will  loosen up your tissues and help with improving the range of motion but do not use heat for the first two weeks following surgery (sometimes heat can increase post-operative swelling).   These exercises can be done on a training (exercise) mat, on the floor, on a table or on a bed. Use whatever works the best and is most comfortable for you.    Use music or television while you are exercising so that the exercises are a pleasant break in your day. This will make your life better with the exercises acting  as a break in your routine that you can look forward to.   Perform all exercises about fifteen times, three times per day or as directed.  You should exercise both the operative leg and the other leg as well.  Exercises include:    Quad Sets - Tighten up the muscle on the front of the thigh (Quad) and hold for 5-10 seconds.    Straight Leg Raises - With your knee straight (if you were given a brace, keep it on), lift the leg to 60 degrees, hold for 3 seconds, and slowly lower the leg.  Perform this exercise against resistance later as your leg gets stronger.   Leg Slides: Lying on your back, slowly slide your foot toward your buttocks, bending your knee up off the floor (only go as far as is comfortable). Then slowly slide your foot back down until your leg is flat on the floor again.   Angel Wings: Lying on your back spread your legs to the side as far apart as you can without causing discomfort.   Hamstring Strength:  Lying on your back, push your heel against the floor with your leg straight by tightening up the muscles of your buttocks.  Repeat, but this time bend your knee to a comfortable angle, and push your heel against the floor.  You may put a pillow under the heel to make it more comfortable if necessary.   A rehabilitation program following joint replacement surgery can speed recovery and prevent re-injury in the future due to weakened muscles. Contact your doctor or a physical therapist for more information on knee rehabilitation.    CONSTIPATION  Constipation is defined medically as fewer than three stools per week and severe constipation as less than one stool per week.  Even if you have a regular bowel pattern at home, your normal regimen is likely to be disrupted due to multiple reasons following surgery.  Combination of anesthesia, postoperative narcotics, change in appetite and fluid intake all can affect your bowels.   YOU MUST use at least one of the following options; they  are listed in order of increasing strength to get the job done.  They are all available over the counter, and you may need to use some, POSSIBLY even all of these options:    Drink plenty of fluids (prune juice may be helpful) and high fiber foods Colace 100 mg by mouth twice a day  Senokot for constipation as directed and as needed Dulcolax (bisacodyl), take with full glass of water  Miralax (polyethylene glycol) once or twice a day as needed.  If you have tried all these things and are unable to have a bowel movement in the first 3-4 days after surgery call either your surgeon or your primary doctor.    If you experience loose stools or diarrhea, hold the medications until you stool forms back up.  If your symptoms do not get better within  1 week or if they get worse, check with your doctor.  If you experience "the worst abdominal pain ever" or develop nausea or vomiting, please contact the office immediately for further recommendations for treatment.   ITCHING:  If you experience itching with your medications, try taking only a single pain pill, or even half a pain pill at a time.  You can also use Benadryl over the counter for itching or also to help with sleep.   TED HOSE STOCKINGS:  Use stockings on both legs until for at least 2 weeks or as directed by physician office. They may be removed at night for sleeping.  MEDICATIONS:  See your medication summary on the After Visit Summary that nursing will review with you.  You may have some home medications which will be placed on hold until you complete the course of blood thinner medication.  It is important for you to complete the blood thinner medication as prescribed.  PRECAUTIONS:  If you experience chest pain or shortness of breath - call 911 immediately for transfer to the hospital emergency department.   If you develop a fever greater that 101 F, purulent drainage from wound, increased redness or drainage from wound, foul odor from the  wound/dressing, or calf pain - CONTACT YOUR SURGEON.                                                   FOLLOW-UP APPOINTMENTS:  If you do not already have a post-op appointment, please call the office for an appointment to be seen by your surgeon.  Guidelines for how soon to be seen are listed in your After Visit Summary, but are typically between 1-4 weeks after surgery.  OTHER INSTRUCTIONS:   Knee Replacement:  Do not place pillow under knee, focus on keeping the knee straight while resting. CPM instructions: 0-90 degrees, 2 hours in the morning, 2 hours in the afternoon, and 2 hours in the evening. Place foam block, curve side up under heel at all times except when in CPM or when walking.  DO NOT modify, tear, cut, or change the foam block in any way.  MAKE SURE YOU:   Understand these instructions.   Get help right away if you are not doing well or get worse.    Thank you for letting us be a part of your medical care team.  It is a privilege we respect greatly.  We hope these instructions will help you stay on track for a fast and full recovery!   --------------------------------------------------------------------------------------------------------------------------------------------------------------- Information on my medicine - ELIQUIS (apixaban)  This medication education was reviewed with me or my healthcare representative as part of my discharge preparation.  The pharmacist that spoke with me during my hospital stay was:  Tashunda Vandezande, RPH  Why was Eliquis prescribed for you? Eliquis was prescribed for you to reduce the risk of blood clots forming after orthopedic surgery.    What do You need to know about Eliquis? Take your Eliquis TWICE DAILY - one tablet in the morning and one tablet in the evening with or without food.  It would be best to take the dose about the same time each day.  If you have difficulty swallowing the tablet whole please discuss with your  pharmacist how to take the medication safely.  Take Eliquis exactly as prescribed by  your doctor and DO NOT stop taking Eliquis without talking to the doctor who prescribed the medication.  Stopping without other medication to take the place of Eliquis may increase your risk of developing a clot.  After discharge, you should have regular check-up appointments with your healthcare provider that is prescribing your Eliquis.  What do you do if you miss a dose? If a dose of ELIQUIS is not taken at the scheduled time, take it as soon as possible on the same day and twice-daily administration should be resumed.  The dose should not be doubled to make up for a missed dose.  Do not take more than one tablet of ELIQUIS at the same time.  Important Safety Information A possible side effect of Eliquis is bleeding. You should call your healthcare provider right away if you experience any of the following: ? Bleeding from an injury or your nose that does not stop. ? Unusual colored urine (red or dark brown) or unusual colored stools (red or black). ? Unusual bruising for unknown reasons. ? A serious fall or if you hit your head (even if there is no bleeding).  Some medicines may interact with Eliquis and might increase your risk of bleeding or clotting while on Eliquis. To help avoid this, consult your healthcare provider or pharmacist prior to using any new prescription or non-prescription medications, including herbals, vitamins, non-steroidal anti-inflammatory drugs (NSAIDs) and supplements.  This website has more information on Eliquis (apixaban): http://www.eliquis.com/eliquis/home

## 2014-11-27 NOTE — Progress Notes (Signed)
Hemovac clamped after 350 cc emptied since arrival to PACU. ([phone])

## 2014-11-27 NOTE — Interval H&P Note (Signed)
History and Physical Interval Note:  11/27/2014 8:26 AM  [name]  has presented today for surgery, with the diagnosis of djd left knee  The various methods of treatment have been discussed with the patient and family. After consideration of risks, benefits and other options for treatment, the patient has consented to  Procedure(s): TOTAL KNEE ARTHROPLASTY (Left) as a surgical intervention .  The patient's history has been reviewed, patient examined, no change in status, stable for surgery.  I have reviewed the patient's chart and labs.  Questions were answered to the patient's satisfaction.     [name]

## 2014-11-27 NOTE — Discharge Summary (Addendum)
Patient ID: [name] MRN: [id number] DOB/AGE: 57/11/1955 57 y.o.  Admit date: 11/27/2014 Discharge date: 11/29/2014  Admission Diagnoses:  Active Problems:   DJD (degenerative joint disease) of knee   Discharge Diagnoses:  Same  Past Medical History  Diagnosis Date  . Hypertension   . GERD (gastroesophageal reflux disease)   . Arthritis   . Cancer     melanomia on back  . Complication of anesthesia     woke up during one of his surgeries    Surgeries: Procedure(s): TOTAL KNEE ARTHROPLASTY on 11/27/2014   Consultants:    Discharged Condition: Improved  Hospital Course: [name] is an 59 y.o. male who was admitted 11/27/2014 for operative treatment of primary localized osteoarthritis left knee. Patient has severe unremitting pain that affects sleep, daily activities, and work/hobbies. After pre-op clearance the patient was taken to the operating room on 11/27/2014 and underwent  Procedure(s): TOTAL KNEE ARTHROPLASTY.  Patient with a pre-op Hb of 14.1 developed ABLA on pod #1 with a Hb of 12.5 and 12.1 on pod#2.  Noor is currently stable but we will continue to follow.  Patient was given perioperative antibiotics:      Anti-infectives    Start     Dose/Rate Route Frequency Ordered Stop   11/27/14 1400  ceFAZolin (ANCEF) IVPB 1 g/50 mL premix     1 g 100 mL/hr over 30 Minutes Intravenous Every 6 hours 11/27/14 1240 11/27/14 2104   11/27/14 0600  ceFAZolin (ANCEF) 3 g in dextrose 5 % 50 mL IVPB     3 g 160 mL/hr over 30 Minutes Intravenous On call to O.R. 11/26/14 1340 11/27/14 0841       Patient was given sequential compression devices, early ambulation, and chemoprophylaxis to prevent DVT.  Patient benefited maximally from hospital stay and there were no complications.    Recent vital signs:  Patient Vitals for the past 24 hrs:  BP Temp Temp src Pulse Resp SpO2  11/29/14 0550 122/67 mmHg 97.9 F (36.6 C) - 85 18 96 %  11/28/14 2011 (!) 145/69 mmHg 97.9 F  (36.6 C) - 81 18 98 %  11/28/14 1354 (!) 143/59 mmHg 97.5 F (36.4 C) Oral 84 18 93 %     Recent laboratory studies:   Recent Labs  11/28/14 0422 11/29/14 0432  WBC 10.3 13.1*  HGB 12.5* 12.1*  HCT 37.5* 36.2*  PLT 224 219  NA 139 137  K 4.4 4.8  CL 101 99*  CO2 34* 31  BUN 17 13  CREATININE 0.95 0.95  GLUCOSE 122* 169*  CALCIUM 9.2 9.4     Discharge Medications:     Medication List    STOP taking these medications        aspirin 81 MG tablet     HYDROcodone-acetaminophen 5-325 MG per tablet  Commonly known as:  NORCO/VICODIN     KRILL OIL PO     OSTEO BI-FLEX JOINT SHIELD PO     vitamin E 1000 UNIT capsule      TAKE these medications        apixaban 2.5 MG Tabs tablet  Commonly known as:  ELIQUIS  Take 1 tab po q12 hours x 14 days following surgery to prevent blood clots     bisacodyl 5 MG EC tablet  Commonly known as:  DULCOLAX  Take 1 tablet (5 mg total) by mouth daily as needed for moderate constipation.     diazepam 2 MG tablet  Commonly  known as:  VALIUM  Take 1 tablet (2 mg total) by mouth every 12 (twelve) hours as needed for muscle spasms.     methocarbamol 500 MG tablet  Commonly known as:  ROBAXIN  Take 1 tablet (500 mg total) by mouth 4 (four) times daily.     multivitamin with minerals Tabs tablet  Take 1 tablet by mouth daily.     olmesartan-hydrochlorothiazide 20-12.5 MG per tablet  Commonly known as:  BENICAR HCT  Take 1 tablet by mouth daily.     ondansetron 4 MG tablet  Commonly known as:  ZOFRAN  Take 1 tablet (4 mg total) by mouth every 8 (eight) hours as needed for nausea or vomiting.     OxyCODONE 10 mg T12a 12 hr tablet  Commonly known as:  OXYCONTIN  Take 1 tablet (10 mg total) by mouth every 12 (twelve) hours.     oxyCODONE-acetaminophen 5-325 MG per tablet  Commonly known as:  ROXICET  Take 1-2 tablets by mouth every 4 (four) hours as needed.     PROSTATE PO  Take 1 tablet by mouth daily.     VITAMIN B  COMPLEX PO  Take 1 tablet by mouth daily.        Diagnostic Studies: Dg Knee Left Port  11/27/2014   CLINICAL DATA:  Status post total knee replacement.  EXAM: PORTABLE LEFT KNEE - 1-2 VIEW  COMPARISON:  07/20/2006 MRI  FINDINGS: The patient has undergone LEFT total knee replacement. Satisfactory position and alignment of the femoral and tibial components. Surgical drain good position.  IMPRESSION: Satisfactory LEFT TKR   Electronically Signed   By: Staci Righter M.D.   On: 11/27/2014 11:32    Disposition: 01-Home or Self Care    Follow-up Information    Follow up with Ninetta Lights, MD. Schedule an appointment as soon as possible for a visit in 2 weeks.   Specialty:  Orthopedic Surgery   Contact information:   [place] [phone]        Signed: Fannie Knee 11/29/2014, 7:40 AM

## 2014-11-27 NOTE — Anesthesia Postprocedure Evaluation (Signed)
Anesthesia Post Note  Patient: [name]  Procedure(s) Performed: Procedure(s) (LRB): TOTAL KNEE ARTHROPLASTY (Left)  Anesthesia type: General  Patient location: PACU  Post pain: Pain level controlled and Adequate analgesia  Post assessment: Post-op Vital signs reviewed, Patient's Cardiovascular Status Stable, Respiratory Function Stable, Patent Airway and Pain level controlled  Last Vitals:  Filed Vitals:   11/27/14 1115  BP: 127/56  Pulse:   Temp:   Resp:     Post vital signs: Reviewed and stable  Level of consciousness: awake, alert  and oriented  Complications: No apparent anesthesia complications

## 2014-11-27 NOTE — H&P (View-Only) (Signed)
TOTAL KNEE ADMISSION H&P  Patient is being admitted for left total knee arthroplasty.  Subjective:  Chief Complaint:left knee pain.  HPI: [name], 59 y.o. male, has a history of pain and functional disability in the left knee due to arthritis and has failed non-surgical conservative treatments for greater than 12 weeks to includeNSAID's and/or analgesics and corticosteriod injections.  Onset of symptoms was gradual, starting 5 years ago with gradually worsening course since that time. The patient noted no past surgery on the left knee(s).  Patient currently rates pain in the left knee(s) at 7 out of 10 with activity. Patient has night pain and crepitus.  Patient has evidence of subchondral sclerosis and joint space narrowing by imaging studies. There is no active infection.  There are no active problems to display for this patient.  No past medical history on file.  No past surgical history on file.   (Not in a hospital admission) Allergies not on file  History  Substance Use Topics  . Smoking status: Not on file  . Smokeless tobacco: Not on file  . Alcohol Use: Not on file    No family history on file.   Review of Systems  Constitutional: Negative.   HENT: Negative.   Eyes: Negative.   Respiratory: Negative.   Cardiovascular: Negative.   Gastrointestinal: Negative.   Genitourinary: Negative.   Musculoskeletal: Positive for joint pain.  Skin: Negative.   Neurological: Negative.   Endo/Heme/Allergies: Negative.   Psychiatric/Behavioral: Negative.     Objective:  Physical Exam  Constitutional: He is oriented to person, place, and time. He appears well-developed and well-nourished.  HENT:  Head: Normocephalic and atraumatic.  Eyes: EOM are normal. Pupils are equal, round, and reactive to light.  Neck: Normal range of motion. Neck supple.  Cardiovascular: Normal rate and regular rhythm.   Respiratory: Effort normal and breath sounds normal. No respiratory  distress. He has no wheezes. He has no rales.  GI: Soft. Bowel sounds are normal.  Musculoskeletal:  Examination of his left knee reveals range of motion from 0-120 degrees.  Marked patellofemoral crepitus.  Medial joint line tenderness.  Nothing laterally.    Neurological: He is alert and oriented to person, place, and time.  Skin: Skin is warm and dry.  Psychiatric: He has a normal mood and affect. His behavior is normal. Judgment and thought content normal.    Vital signs in last 24 hours: @VSRANGES @  Labs:   There is no height or weight on file to calculate BMI.   Imaging Review Plain radiographs demonstrate severe degenerative joint disease of the left knee(s). The overall alignment isneutral. The bone quality appears to be fair for age and reported activity level.  Assessment/Plan:  End stage arthritis, left knee   The patient history, physical examination, clinical judgment of the provider and imaging studies are consistent with end stage degenerative joint disease of the left knee(s) and total knee arthroplasty is deemed medically necessary. The treatment options including medical management, injection therapy arthroscopy and arthroplasty were discussed at length. The risks and benefits of total knee arthroplasty were presented and reviewed. The risks due to aseptic loosening, infection, stiffness, patella tracking problems, thromboembolic complications and other imponderables were discussed. The patient acknowledged the explanation, agreed to proceed with the plan and consent was signed. Patient is being admitted for inpatient treatment for surgery, pain control, PT, OT, prophylactic antibiotics, VTE prophylaxis, progressive ambulation and ADL's and discharge planning. The patient is planning to be discharged home with  home health services

## 2014-11-28 ENCOUNTER — Encounter (HOSPITAL_COMMUNITY): Payer: Self-pay | Admitting: General Practice

## 2014-11-28 LAB — CBC
HCT: 37.5 % — ABNORMAL LOW (ref 39.0–52.0)
HEMOGLOBIN: 12.5 g/dL — AB (ref 13.0–17.0)
MCH: 32.7 pg (ref 26.0–34.0)
MCHC: 33.3 g/dL (ref 30.0–36.0)
MCV: 98.2 fL (ref 78.0–100.0)
Platelets: 224 10*3/uL (ref 150–400)
RBC: 3.82 MIL/uL — ABNORMAL LOW (ref 4.22–5.81)
RDW: 13.4 % (ref 11.5–15.5)
WBC: 10.3 10*3/uL (ref 4.0–10.5)

## 2014-11-28 LAB — BASIC METABOLIC PANEL
ANION GAP: 4 — AB (ref 5–15)
BUN: 17 mg/dL (ref 6–20)
CHLORIDE: 101 mmol/L (ref 101–111)
CO2: 34 mmol/L — ABNORMAL HIGH (ref 22–32)
Calcium: 9.2 mg/dL (ref 8.9–10.3)
Creatinine, Ser: 0.95 mg/dL (ref 0.61–1.24)
GFR calc Af Amer: >60 mL/min (ref >60)
GFR calc non Af Amer: >60 mL/min (ref >60)
GLUCOSE: 122 mg/dL — AB (ref 65–99)
POTASSIUM: 4.4 mmol/L (ref 3.5–5.1)
Sodium: 139 mmol/L (ref 135–145)

## 2014-11-28 MED ORDER — DIAZEPAM 2 MG PO TABS: 2.0000 mg | ORAL_TABLET | Freq: Two times a day (BID) | ORAL | Status: DC | PRN

## 2014-11-28 MED ORDER — OXYCODONE HCL ER 10 MG PO T12A
10.0000 mg | EXTENDED_RELEASE_TABLET | Freq: Two times a day (BID) | ORAL | Status: DC
  Administered 2014-11-28 – 2014-11-29 (×3): 10 mg via ORAL
  Filled 2014-11-28 (×3): qty 1

## 2014-11-28 NOTE — Progress Notes (Signed)
Subjective: 1 Day Post-Op Procedure(s) (LRB): TOTAL KNEE ARTHROPLASTY (Left) Patient reports pain as moderate.  Patient reports nausea and anxiety yesterday and throughout the night.  His father passed away late yesterday afternoon and he thinks these symptoms are from that.  He also states that he had a terrible headache yesterday afternoon after getting the dilaudid.  Once he stopped taking the dilaudid, he has no longer had a headache.  No lightheadedness/dizziness, chest pain/sob.  Negative flatus/bm.  Tolerating diet.    Objective: Vital signs in last 24 hours: Temp:  [97.4 F (36.3 C)-98.4 F (36.9 C)] 97.9 F (36.6 C) (08/04 0600) Pulse Rate:  [59-88] 70 (08/04 0600) Resp:  [6-18] 18 (08/04 0600) BP: (117-174)/(37-97) 124/52 mmHg (08/04 0600) SpO2:  [91 %-100 %] 99 % (08/04 0600)  Intake/Output from previous day: 08/03 0701 - 08/04 0700 In: 1700 [I.V.:1700] Out: 1460 [Urine:800; Drains:610; Blood:50] Intake/Output this shift: Total I/O In: -  Out: 600 [Urine:600]   Recent Labs  11/28/14 0422  HGB 12.5*    Recent Labs  11/28/14 0422  WBC 10.3  RBC 3.82*  HCT 37.5*  PLT 224    Recent Labs  11/28/14 0422  NA 139  K 4.4  CL 101  CO2 34*  BUN 17  CREATININE 0.95  GLUCOSE 122*  CALCIUM 9.2   No results for input(s): LABPT, INR in the last 72 hours.  Neurologically intact Neurovascular intact Sensation intact distally Intact pulses distally Dorsiflexion/Plantar flexion intact Compartment soft  hemovac drain pulled by me today  Assessment/Plan: 1 Day Post-Op Procedure(s) (LRB): TOTAL KNEE ARTHROPLASTY (Left) Advance diet Up with therapy D/C IV fluids Plan for discharge tomorrow  WBAT LLE ABLA- mild and stable Dry dressing change prn Will order valium for anxiety and oxycontin for pain control  Fannie Knee 11/28/2014, 6:48 AM

## 2014-11-28 NOTE — Evaluation (Signed)
Occupational Therapy Evaluation Patient Details Name: [name] MRN: [id number] DOB: 09-Nov-1955 Today's Date: 11/28/2014    History of Present Illness Pt is a 59 y/o M s/p L TKA.  Pt's PMH includes HTN.   Clinical Impression   Patient presenting with decreased ADL and functional mobility independence. Patient independent PTA. Patient currently functioning at an overall min>mod assist level. Patient will benefit from acute OT to increase overall independence in the areas of ADLs, functional mobility, and overall safety in order to safely discharge home with wife.     Follow Up Recommendations  No OT follow up;Supervision/Assistance - 24 hour    Equipment Recommendations  None recommended by OT    Recommendations for Other Services  None at this time   Precautions / Restrictions Precautions Precautions: Knee;Fall Precaution Comments: Reviewed knee precautions Restrictions Weight Bearing Restrictions: Yes LLE Weight Bearing: Weight bearing as tolerated    Mobility Bed Mobility Overal bed mobility: Needs Assistance Bed Mobility: Supine to Sit     Supine to sit: Supervision     General bed mobility comments: Supervision for safety. HOB flat, mod use of bed rails.   Transfers Overall transfer level: Needs assistance Equipment used: Rolling walker (2 wheeled) Transfers: Sit to/from Stand Sit to Stand: Min assist;From elevated surface General transfer comment: Min assist to power up into standing. Cues for hand placement and sequencing.     Balance Overall balance assessment: Needs assistance Sitting-balance support: No upper extremity supported;Feet supported Sitting balance-Leahy Scale: Good     Standing balance support: Bilateral upper extremity supported;During functional activity Standing balance-Leahy Scale: Fair    ADL Overall ADL's : Needs assistance/impaired General ADL Comments: Pt requires up to mod assist for LB ADLs and set-up for UB ADLs. Pt limited  by increased pain today and states that he didn't sleep well last night. Pt engaged in bed mobility, sat EOB with minimal dizziness, then ambulated into BR. Pt sat on BSC, then stood to urinate. Pt stood at sink with min guard to wash hands. Left pt seated in recliner with family present.     Vision Additional Comments: no change from baseline          Pertinent Vitals/Pain Pain Assessment: 0-10 Pain Score: 10-Worst pain ever Pain Location: L knee Pain Descriptors / Indicators: Aching;Sharp;Tender Pain Intervention(s): Limited activity within patient's tolerance;Monitored during session;Repositioned;Ice applied     Hand Dominance Right   Extremity/Trunk Assessment Upper Extremity Assessment Upper Extremity Assessment: Overall WFL for tasks assessed;RUE deficits/detail RUE Deficits / Details: pt reports decreased strength in right forearm, but overall WFL   Lower Extremity Assessment Lower Extremity Assessment: Defer to PT evaluation   Cervical / Trunk Assessment Cervical / Trunk Assessment: Normal   Communication Communication Communication: No difficulties   Cognition Arousal/Alertness: Lethargic Behavior During Therapy: WFL for tasks assessed/performed Overall Cognitive Status: Within Functional Limits for tasks assessed              Home Living Family/patient expects to be discharged to:: Private residence Living Arrangements: Spouse/significant other Available Help at Discharge: Family;Available 24 hours/day (wife and granddaughter) Type of Home: Other(Comment) (townhome) Home Access: Stairs to enter [employer] of Steps: 1 threshold   Home Layout: One level     Bathroom Shower/Tub: Walk-in shower;Door   Bathroom Toilet: Handicapped height     Home Equipment: Environmental consultant - 2 wheels;Crutches;Bedside commode;Shower seat - built in   Prior Functioning/Environment Level of Independence: Independent     OT Diagnosis: Generalized weakness;Acute pain  OT Problem List: Decreased strength;Decreased range of motion;Decreased activity tolerance;Impaired balance (sitting and/or standing);Decreased safety awareness;Decreased knowledge of use of DME or AE;Decreased knowledge of precautions;Pain   OT Treatment/Interventions: Self-care/ADL training;Therapeutic exercise;Energy conservation;DME and/or AE instruction;Therapeutic activities;Patient/family education;Balance training    OT Goals(Current goals can be found in the care plan section) Acute Rehab OT Goals Patient Stated Goal: decrease pain OT Goal Formulation: With patient Time For Goal Achievement: 12/12/14 Potential to Achieve Goals: Good ADL Goals Pt Will Perform Grooming: with modified independence;standing Pt Will Perform Lower Body Bathing: with modified independence;sit to/from stand Pt Will Perform Lower Body Dressing: with modified independence;sit to/from stand Pt Will Transfer to Toilet: with modified independence;ambulating;bedside commode Pt Will Perform Toileting - Clothing Manipulation and hygiene: with modified independence;sit to/from stand Pt Will Perform Tub/Shower Transfer: Shower transfer;with modified independence;rolling walker;shower seat;ambulating  OT Frequency: Min 2X/week   Barriers to D/C: None known at this time   End of Session Equipment Utilized During Treatment: Gait belt;Rolling walker CPM Left Knee CPM Left Knee: Off  Activity Tolerance: Patient limited by pain;Patient limited by lethargy Patient left: in chair;with call bell/phone within reach;with family/visitor present   Time: 3474-2595 OT Time Calculation (min): 39 min Charges:  OT General Charges $OT Visit: 1 Procedure OT Evaluation $Initial OT Evaluation Tier I: 1 Procedure OT Treatments $Self Care/Home Management : 23-37 mins  Temima Kutsch , MS, OTR/L, CLT Pager: [phone]  11/28/2014, 9:03 AM

## 2014-11-28 NOTE — Progress Notes (Signed)
Physical Therapy Treatment Patient Details Name: [name] MRN: [id number] DOB: February 20, 1956 Today's Date: 11/28/2014    History of Present Illness Pt is a 59 y/o M s/p L TKA.  Pt's PMH includes HTN.    PT Comments    Pt is making good progress w/ PT but will benefit from focus on gait mechanics at future sessions.  Pt will benefit from continued skilled PT services to increase functional independence and safety.  Follow Up Recommendations  Home health PT;Supervision for mobility/OOB     Equipment Recommendations  None recommended by PT    Recommendations for Other Services       Precautions / Restrictions Precautions Precautions: Knee;Fall Precaution Booklet Issued: Yes (comment) Precaution Comments: Rewviewed knee precautions Restrictions Weight Bearing Restrictions: Yes LLE Weight Bearing: Weight bearing as tolerated    Mobility  Bed Mobility Overal bed mobility: Needs Assistance Bed Mobility: Supine to Sit     Supine to sit: Min guard     General bed mobility comments: Min guard 2/2 pt's mild instability moving from supine>sit.  No use of bed rails and HOB flat.  Again cues provided for proper breathing technique as pt demonstrates valsalva maneuver.  Transfers Overall transfer level: Needs assistance Equipment used: Rolling walker (2 wheeled) Transfers: Sit to/from Stand Sit to Stand: Min assist;From elevated surface         General transfer comment: Min assist to assist pt to power up to standing and reminder for hand placement on bed to push up.  Good technique for stand>sit.  Ambulation/Gait Ambulation/Gait assistance: Min guard Ambulation Distance (Feet): 100 Feet Assistive device: Rolling walker (2 wheeled) Gait Pattern/deviations: Step-to pattern;Step-through pattern;Decreased stride length;Decreased stance time - left;Decreased weight shift to left;Antalgic;Trunk flexed   Gait velocity interpretation: Below normal speed for age/gender General  Gait Details: Emergent step through gait pattern which improves w/ cues to stand upright as pt relies heavily on RW.  Encouraged to progressively increase WB through LLE as he can tolerate.  3 standing rest breaks 2/2 generalized fatigue and pain in L knee.   Stairs            Wheelchair Mobility    Modified Rankin (Stroke Patients Only)       Balance Overall balance assessment: Needs assistance Sitting-balance support: No upper extremity supported;Feet supported Sitting balance-Leahy Scale: Good     Standing balance support: During functional activity;No upper extremity supported Standing balance-Leahy Scale: Fair Standing balance comment: Pt able to stand at toilet w/o UE support                    Cognition Arousal/Alertness: Awake/alert Behavior During Therapy: WFL for tasks assessed/performed Overall Cognitive Status: Within Functional Limits for tasks assessed                      Exercises Total Joint Exercises Ankle Circles/Pumps: AROM;Both;15 reps;Supine Quad Sets: AROM;Both;Supine;15 reps Straight Leg Raises: AROM;Left;5 reps;Supine Knee Flexion: AROM;Left;5 reps;Seated Goniometric ROM: 0-87    General Comments        Pertinent Vitals/Pain Pain Assessment: 0-10 Pain Score: 5  Pain Location: L knee that dec w/ mobility Pain Descriptors / Indicators: Aching;Discomfort;Grimacing Pain Intervention(s): Limited activity within patient's tolerance;Monitored during session;Repositioned    Home Living Family/patient expects to be discharged to:: Private residence Living Arrangements: Spouse/significant other Available Help at Discharge: Family;Available 24 hours/day (wife and granddaughter) Type of Home: Other(Comment) (townhome) Home Access: Stairs to enter   Home Layout: One level  Home Equipment: Walker - 2 wheels;Crutches;Bedside commode;Shower seat - built in      Prior Function Level of Independence: Independent          PT  Goals (current goals can now be found in the care plan section) Acute Rehab PT Goals Patient Stated Goal: decrease pain PT Goal Formulation: With patient/family Time For Goal Achievement: 12/04/14 Potential to Achieve Goals: Good Progress towards PT goals: Progressing toward goals    Frequency  7X/week    PT Plan Current plan remains appropriate    Co-evaluation             End of Session Equipment Utilized During Treatment: Gait belt Activity Tolerance: Patient limited by pain;Patient limited by fatigue Patient left: in chair;with call bell/phone within reach;with family/visitor present     Time: 7703-4035 PT Time Calculation (min) (ACUTE ONLY): 38 min  Charges:  $Gait Training: 23-37 mins $Therapeutic Exercise: 8-22 mins                    G Codes:      Joslyn Hy PT, [place] [phone] Pager: [phone] 11/28/2014, 11:47 AM

## 2014-11-28 NOTE — Care Management Utilization Note (Signed)
Utilization review completed. Khanh Cordner, RN Case Manager [phone]. 

## 2014-11-28 NOTE — Op Note (Signed)
[name], [name] NO.:  [id number]  MEDICAL RECORD NO.:  [id number]  LOCATION:  5N03C                        FACILITY:  [facility]  PHYSICIAN:  Ninetta Lights, M.D. DATE OF BIRTH:  03/16/1956  DATE OF PROCEDURE:  11/27/2014 DATE OF DISCHARGE:                              OPERATIVE REPORT   PREOPERATIVE DIAGNOSIS:  Left knee end-stage arthritis, primary, generalized.  POSTOPERATIVE DIAGNOSIS:  Left knee end-stage arthritis, primary, generalized.  PROCEDURE:  Modified minimally-invasive left total knee replacement with Stryker triathlon prosthesis.  Soft tissue balancing.  Cemented pegged posterior stabilized #6 femoral component.  Cemented #6 tibial component and 9-mm PS insert.  Cemented resurfacing 35-mm patellar component.  SURGEON:  Ninetta Lights, M.D.  ASSISTANT:  Tawanna Cooler, PA, present throughout the entire case and necessary for timely completion of procedure.  ANESTHESIA:  General.  BLOOD LOSS:  Minimal.  SPECIMENS:  None.  CULTURES:  None.  COMPLICATIONS:  None.  DRESSINGS:  Soft compressive knee immobilizer.  TOURNIQUET TIME:  50 minutes.  DRAINS:  Hemovac x1.  DESCRIPTION OF PROCEDURE:  The patient was brought to the operating room and placed on the operating table in supine position.  After adequate anesthesia had been obtained, tourniquet applied, prepped and draped in usual sterile fashion.  Exsanguinated with elevation of Esmarch. Tourniquet was inflated to 350 mmHg.  Longitudinal incision above the patella down to tibial tubercle.  Medial arthrotomy, vastus splitting. Distal femur exposed.  Intramedullary guide, a flexible rod.  An 8-mm resection, 5 degrees of valgus.  Using epicondylar axis, the femur was sized, cut, and fitted for a pegged posterior stabilized #6 component. Extramedullary guide.  Proximal tibial resection.  A 3-degree posterior slope cut.  Size #6 component.  Patella exposed.  Posterior  10-mm removed.  Drilled, sized, and fitted for a 35-mm component.  Trial was put in place.  A 9-mm insert on the tibia.  With this construct, nicely balanced knee, full motion, good stability flexion and extension, good patellar tracking.  Tibia was marked for rotation and hand reamed.  All trials were removed.  Copious irrigation with a pulse irrigating device. Cement prepared, placed on all components, firmly seated.  Polyethylene was attached to the tibia and knee reduced.  Patella was held with a clamp.  Once the cement hardened, the knee was irrigated again.  Soft tissue was injected with Exparel.  Hemovac was placed.  Ethibond was used to close the arthrotomy.  Subcutaneous and subcuticular closure. Margins were injected with Marcaine.  Sterile compressive dressing applied.  Tourniquet was deflated and removed.  Knee immobilizer applied.  Anesthesia was reversed.  Brought to the recovery room. Tolerated the surgery well.  No complications.     Ninetta Lights, M.D.     DFM/MEDQ  D:  11/27/2014  T:  11/28/2014  Job:  [id number]

## 2014-11-28 NOTE — Care Management Note (Signed)
Case Management Note  Patient Details  Name: [name] MRN: [id number] Date of Birth: 17-Oct-1955  Subjective/Objective:    59 yr old male s/p left total knee arthroplasty.                Action/Plan:  Case manager spoke with patient concerning home health and DME needs. Patient was preoperatively setup with [facility] care. No changes. Patient has family support at discharge.    Expected Discharge Date:    11/29/14             Expected Discharge Plan:   Home with Home Health  In-House Referral:  NA  Discharge planning Services  CM Consult  Post Acute Care Choice:  Durable Medical Equipment, Home Health Choice offered to:  Patient  DME Arranged:  3-N-1, CPM, Walker rolling DME Agency:  [employer]  HH Arranged:  PT HH Agency:  [facility]  Status of Service:  Completed, signed off  Medicare Important Message Given:    Date Medicare IM Given:    Medicare IM give by:    Date Additional Medicare IM Given:    Additional Medicare Important Message give by:     If discussed at [place] of Stay Meetings, dates discussed:    Additional Comments:  Ninfa Meeker, RN 11/28/2014, 11:53 AM

## 2014-11-28 NOTE — Progress Notes (Signed)
Physical Therapy Treatment Patient Details Name: [name] MRN: [id number] DOB: 01/07/1956 Today's Date: 11/28/2014    History of Present Illness Pt is a 59 y/o M s/p L TKA.  Pt's PMH includes HTN.    PT Comments    See notes below regarding pt's potential need for bariatric walker (in general comments).  [name] continues to demonstrate fantastic progress w/ ambulation and therapeutic exercises.  Pt will benefit from continued skilled PT services to increase functional independence and safety.   Follow Up Recommendations  Home health PT;Supervision for mobility/OOB     Equipment Recommendations  None recommended by PT (see general notes)    Recommendations for Other Services       Precautions / Restrictions Precautions Precautions: Knee;Fall Precaution Booklet Issued: Yes (comment) Precaution Comments: Rewviewed knee precautions Restrictions Weight Bearing Restrictions: Yes LLE Weight Bearing: Weight bearing as tolerated    Mobility  Bed Mobility Overal bed mobility: Modified Independent Bed Mobility: Supine to Sit;Sit to Supine     Supine to sit: Modified independent (Device/Increase time) Sit to supine: Modified independent (Device/Increase time)   General bed mobility comments: Mod I w/o use of rails and w/ HOB flat.  No cues or physical assist required.  Transfers Overall transfer level: Needs assistance Equipment used: Rolling walker (2 wheeled) Transfers: Sit to/from Stand Sit to Stand: From elevated surface;Min guard         General transfer comment: Min guard assist for safety.  Pt w/ good technqiue w/ bed elevated.    Ambulation/Gait Ambulation/Gait assistance: Supervision Ambulation Distance (Feet): 250 Feet Assistive device: Rolling walker (2 wheeled) Gait Pattern/deviations: Step-through pattern;Antalgic;Decreased weight shift to left;Decreased stance time - left   Gait velocity interpretation: Below normal speed for age/gender General  Gait Details: Bariatric walker introduced which improved gait pattern.  Min VCs to relax shoulders and stand upright.     Stairs            Wheelchair Mobility    Modified Rankin (Stroke Patients Only)       Balance Overall balance assessment: Needs assistance Sitting-balance support: No upper extremity supported;Feet supported Sitting balance-Leahy Scale: Good     Standing balance support: During functional activity;No upper extremity supported Standing balance-Leahy Scale: Fair Standing balance comment: Pt able to adjust gown while standing w/o either UE supported                    Cognition Arousal/Alertness: Awake/alert Behavior During Therapy: WFL for tasks assessed/performed Overall Cognitive Status: Within Functional Limits for tasks assessed                      Exercises Total Joint Exercises Ankle Circles/Pumps: AROM;Both;15 reps;Supine Quad Sets: AROM;Both;Supine;15 reps Straight Leg Raises: AROM;Left;5 reps;Supine Long Arc Quad: Strengthening;Left;10 reps;Seated Knee Flexion: AROM;Left;5 reps;Seated Goniometric ROM: 0-87    General Comments General comments (skin integrity, edema, etc.): Pt has RW at home but unclear if it is a bariatric walker or not.  PT and pt spoke w/ [name] from [employer] in the hallway who says he fit the walker to fit the pt when he saw the pt at home.  Pt's wife will bring in the walker before next PT session so it can be trialed to determine if a bariatric walker needs to be ordered.      Pertinent Vitals/Pain Pain Assessment: 0-10 Pain Score: 3  Pain Location: L knee Pain Descriptors / Indicators: Discomfort;Grimacing;Heaviness Pain Intervention(s): Limited activity within patient's tolerance;Monitored  during session;Repositioned    Home Living                      Prior Function            PT Goals (current goals can now be found in the care plan section) Acute Rehab PT Goals Patient Stated Goal:  none stated PT Goal Formulation: With patient/family Time For Goal Achievement: 12/04/14 Potential to Achieve Goals: Good Progress towards PT goals: Progressing toward goals    Frequency  7X/week    PT Plan Current plan remains appropriate    Co-evaluation             End of Session Equipment Utilized During Treatment: Gait belt Activity Tolerance: Patient tolerated treatment well Patient left: with call bell/phone within reach;with family/visitor present;in bed;in CPM     Time: 6122-4497 PT Time Calculation (min) (ACUTE ONLY): 34 min  Charges:  $Gait Training: 8-22 mins $Therapeutic Exercise: 8-22 mins                    G Codes:      Joslyn Hy PT, DPT [phone] Pager: [phone] 11/28/2014, 3:28 PM

## 2014-11-29 LAB — CBC
HEMATOCRIT: 36.2 % — AB (ref 39.0–52.0)
Hemoglobin: 12.1 g/dL — ABNORMAL LOW (ref 13.0–17.0)
MCH: 32.4 pg (ref 26.0–34.0)
MCHC: 33.4 g/dL (ref 30.0–36.0)
MCV: 97.1 fL (ref 78.0–100.0)
Platelets: 219 10*3/uL (ref 150–400)
RBC: 3.73 MIL/uL — ABNORMAL LOW (ref 4.22–5.81)
RDW: 12.9 % (ref 11.5–15.5)
WBC: 13.1 10*3/uL — ABNORMAL HIGH (ref 4.0–10.5)

## 2014-11-29 LAB — BASIC METABOLIC PANEL
ANION GAP: 7 (ref 5–15)
BUN: 13 mg/dL (ref 6–20)
CALCIUM: 9.4 mg/dL (ref 8.9–10.3)
CHLORIDE: 99 mmol/L — AB (ref 101–111)
CO2: 31 mmol/L (ref 22–32)
Creatinine, Ser: 0.95 mg/dL (ref 0.61–1.24)
GFR calc Af Amer: >60 mL/min (ref >60)
GFR calc non Af Amer: >60 mL/min (ref >60)
GLUCOSE: 169 mg/dL — AB (ref 65–99)
Potassium: 4.8 mmol/L (ref 3.5–5.1)
Sodium: 137 mmol/L (ref 135–145)

## 2014-11-29 MED ORDER — OXYCODONE HCL ER 10 MG PO T12A: 10.0000 mg | EXTENDED_RELEASE_TABLET | Freq: Two times a day (BID) | ORAL | Status: AC

## 2014-11-29 MED ORDER — DIAZEPAM 2 MG PO TABS: 2.0000 mg | ORAL_TABLET | Freq: Two times a day (BID) | ORAL | Status: AC | PRN

## 2014-11-29 NOTE — Progress Notes (Signed)
Occupational Therapy Treatment Patient Details Name: [name] MRN: [id number] DOB: [birth year] Today's Date: 11/29/2014    History of present illness Pt is a 59 y/o M s/p L TKA.  Pt's PMH includes HTN.   OT comments  Pt. Progressing well with acute OT goals.  Safe return demo of shower stall transfer, and wife to assist with LB ADLS.  Pt. Eager for d/c home.    Follow Up Recommendations  No OT follow up;Supervision/Assistance - 24 hour    Equipment Recommendations  None recommended by OT    Recommendations for Other Services      Precautions / Restrictions Precautions Precautions: Knee;Fall Precaution Comments: Rewviewed knee precautions Restrictions LLE Weight Bearing: Weight bearing as tolerated       Mobility Bed Mobility               General bed mobility comments: in b.room upon arrival.  states he has a very high bed at home, has concerns for getting in bed.  PT notified to review at their session as pt. was wanting to stay in recliner at end of my session  Transfers Overall transfer level: Needs assistance Equipment used: Rolling walker (2 wheeled) Transfers: Sit to/from [employer] Sit to Stand: Supervision Stand pivot transfers: Supervision       General transfer comment: cues for hand placement and sliding surgical leg forward while transitioning into sitting    Balance                                   ADL Overall ADL's : Needs assistance/impaired     Grooming: Wash/dry hands;Min guard;Standing         Lower Body Bathing Details (indicate cue type and reason): declined review, wife present and reports she will assist as needed       Lower Body Dressing Details (indicate cue type and reason): declined review, wife donned short in standing, (against therapist and wife request) pt. states "i got this just watch", wife on knees donning pants over each foot then pt. bent forward and pulled up pants.  rec.  sitting in recliner donning over feet then standing up, pt. refused and insisted they be donned like that.  reviewed safety and fall risks with this method, wife agreed and tried to have pt. sit but he would not Toilet Transfer: Supervision/safety;RW;Ambulation;Comfort height toilet;Grab bars   Toileting- Clothing Manipulation and Hygiene: Minimal assistance;Sit to/from stand   Tub/ Banker: Walk-in shower;Anterior/posterior;Rolling walker;Grab bars;Shower seat;Min Chief Executive Officer Details (indicate cue type and reason): built in shower seat Functional mobility during ADLs: Min guard General ADL Comments: pt. moving well, states the walker is starting to "get in his way" during ambulation.  wife to complete LB ADLS. pt. able to return safe demo of shower stall transfer.        Vision                     Perception     Praxis      Cognition   Behavior During Therapy: [facility] for tasks assessed/performed Overall Cognitive Status: Within Functional Limits for tasks assessed                       Extremity/Trunk Assessment               Exercises     Shoulder Instructions  General Comments      Pertinent Vitals/ Pain       Pain Assessment: No/denies pain  Home Living                                          Prior Functioning/Environment              Frequency Min 2X/week     Progress Toward Goals  OT Goals(current goals can now be found in the care plan section)  Progress towards OT goals: Progressing toward goals     Plan Discharge plan remains appropriate    Co-evaluation                 End of Session Equipment Utilized During Treatment: Rolling walker CPM Left Knee CPM Left Knee: Off   Activity Tolerance Patient tolerated treatment well   Patient Left     Nurse Communication          Time: 3220-2542 OT Time Calculation (min): 19 min  Charges: OT General Charges $OT Visit: 1  Procedure OT Treatments $Self Care/Home Management : 8-22 mins  Janice Coffin, COTA/L 11/29/2014, 8:44 AM

## 2014-11-29 NOTE — Progress Notes (Signed)
Subjective: 2 Days Post-Op Procedure(s) (LRB): TOTAL [name] (Left) Patient reports pain as mild.  Mild lightheadedness/dizziness.  No nausea/vomiting, chest pain/sob.  Tolerating diet.  Objective: Vital signs in last 24 hours: Temp:  [97.5 F (36.4 C)-97.9 F (36.6 C)] 97.9 F (36.6 C) (08/05 0550) Pulse Rate:  [81-85] 85 (08/05 0550) Resp:  [18] 18 (08/05 0550) BP: (122-145)/(59-69) 122/67 mmHg (08/05 0550) SpO2:  [93 %-98 %] 96 % (08/05 0550)  Intake/Output from previous day: 08/04 0701 - 08/05 0700 In: 2300 [I.V.:2300] Out: 1000 [Urine:1000] Intake/Output this shift:     Recent Labs  11/28/14 0422 11/29/14 0432  HGB 12.5* 12.1*    Recent Labs  11/28/14 0422 11/29/14 0432  WBC 10.3 13.1*  RBC 3.82* 3.73*  HCT 37.5* 36.2*  PLT 224 219    Recent Labs  11/28/14 0422 11/29/14 0432  NA 139 137  K 4.4 4.8  CL 101 99*  CO2 34* 31  BUN 17 13  CREATININE 0.95 0.95  GLUCOSE 122* 169*  CALCIUM 9.2 9.4   No results for input(s): LABPT, INR in the last 72 hours.  Neurologically intact Neurovascular intact Sensation intact distally Intact pulses distally Dorsiflexion/Plantar flexion intact Incision: no drainage No cellulitis present Compartment soft  Dressing changed by me today  Assessment/Plan: 2 Days Post-Op Procedure(s) (LRB): TOTAL [name] (Left) Advance diet Up with therapy Discharge home with home health today WBAT LLE ABLA- mild and stable Dry dressing change prn  [name] 11/29/2014, 7:37 AM

## 2014-11-29 NOTE — Progress Notes (Signed)
Physical Therapy Treatment Patient Details Name: [name] MRN: [id number] DOB: May 17, 1955 Today's Date: 11/29/2014    History of Present Illness Pt is a 59 y/o M s/p L TKA.  Pt's PMH includes HTN.    PT Comments    Patient is making good progress with PT.  From a mobility standpoint anticipate patient will be ready for DC home with family assistance.     Follow Up Recommendations  Home health PT;Supervision for mobility/OOB     Equipment Recommendations   (has rolling walker)    Recommendations for Other Services       Precautions / Restrictions Precautions Precautions: Knee Precaution Comments: Review of HEP Restrictions Weight Bearing Restrictions: Yes LLE Weight Bearing: Weight bearing as tolerated    Mobility  Bed Mobility               General bed mobility comments: patient able to go stand to edge of elevated bed. Simulation of home bed height.   Transfers Overall transfer level: Modified independent Equipment used: Rolling walker (2 wheeled) Transfers: Sit to/from Stand Sit to Stand: Modified independent (Device/Increase time) Stand pivot transfers: Supervision       General transfer comment: cues for hand placement and sliding surgical leg forward while transitioning into sitting  Ambulation/Gait Ambulation/Gait assistance: Supervision Ambulation Distance (Feet): 200 Feet Assistive device: Rolling walker (2 wheeled) Gait Pattern/deviations: Step-through pattern (cues for knee flexion with swing phase)         Stairs Stairs:  (declined attempting, no stairs at home)          Wheelchair Mobility    Modified Rankin (Stroke Patients Only)       Balance Overall balance assessment: Modified Independent Sitting-balance support: No upper extremity supported Sitting balance-Leahy Scale: Good     Standing balance support: No upper extremity supported Standing balance-Leahy Scale: Fair                      Cognition  Arousal/Alertness: Awake/alert Behavior During Therapy: WFL for tasks assessed/performed Overall Cognitive Status: Within Functional Limits for tasks assessed                      Exercises Total Joint Exercises Quad Sets: Strengthening;10 reps;Supine Knee Flexion: Left;10 reps;Seated Goniometric ROM: 0-91    General Comments        Pertinent Vitals/Pain Pain Assessment: No/denies pain Pain Intervention(s): Monitored during session    Home Living                      Prior Function            PT Goals (current goals can now be found in the care plan section) Acute Rehab PT Goals Patient Stated Goal: go home PT Goal Formulation: With patient/family Time For Goal Achievement: 12/04/14 Potential to Achieve Goals: Good Progress towards PT goals: Progressing toward goals    Frequency  7X/week    PT Plan Current plan remains appropriate    Co-evaluation             End of Session Equipment Utilized During Treatment: Gait belt Activity Tolerance: Patient tolerated treatment well Patient left: with call bell/phone within reach;with family/visitor present;in chair     Time: 1191-4782 PT Time Calculation (min) (ACUTE ONLY): 26 min  Charges:  $Gait Training: 8-22 mins $Therapeutic Exercise: 8-22 mins  G Codes:      Cassell Clement, PT, CSCS Pager [phone] Office [phone]  11/29/2014, 9:10 AM

## 2015-02-18 ENCOUNTER — Other Ambulatory Visit: Payer: Self-pay | Admitting: Surgery

## 2015-03-19 ENCOUNTER — Encounter (HOSPITAL_COMMUNITY)
Admission: RE | Admit: 2015-03-19 | Discharge: 2015-03-19 | Disposition: A | Discharge status: Home / Self Care | Payer: BLUE CROSS/BLUE SHIELD | Source: Ambulatory Visit | Attending: Surgery | Admitting: Surgery

## 2015-03-19 ENCOUNTER — Encounter (HOSPITAL_COMMUNITY): Payer: Self-pay

## 2015-03-19 DIAGNOSIS — I10 Essential (primary) hypertension: Secondary | ICD-10-CM | POA: Insufficient documentation

## 2015-03-19 DIAGNOSIS — Z7982 Long term (current) use of aspirin: Secondary | ICD-10-CM | POA: Insufficient documentation

## 2015-03-19 DIAGNOSIS — K429 Umbilical hernia without obstruction or gangrene: Secondary | ICD-10-CM | POA: Diagnosis not present

## 2015-03-19 DIAGNOSIS — K219 Gastro-esophageal reflux disease without esophagitis: Secondary | ICD-10-CM | POA: Diagnosis not present

## 2015-03-19 DIAGNOSIS — Z01818 Encounter for other preprocedural examination: Secondary | ICD-10-CM | POA: Diagnosis not present

## 2015-03-19 DIAGNOSIS — Z79899 Other long term (current) drug therapy: Secondary | ICD-10-CM | POA: Insufficient documentation

## 2015-03-19 DIAGNOSIS — Z01812 Encounter for preprocedural laboratory examination: Secondary | ICD-10-CM | POA: Insufficient documentation

## 2015-03-19 DIAGNOSIS — Z96653 Presence of artificial knee joint, bilateral: Secondary | ICD-10-CM | POA: Insufficient documentation

## 2015-03-19 LAB — BASIC METABOLIC PANEL
Anion gap: 6 (ref 5–15)
BUN: 13 mg/dL (ref 6–20)
CHLORIDE: 106 mmol/L (ref 101–111)
CO2: 29 mmol/L (ref 22–32)
Calcium: 10.1 mg/dL (ref 8.9–10.3)
Creatinine, Ser: 0.88 mg/dL (ref 0.61–1.24)
Glucose, Bld: 163 mg/dL — ABNORMAL HIGH (ref 65–99)
POTASSIUM: 4.6 mmol/L (ref 3.5–5.1)
SODIUM: 141 mmol/L (ref 135–145)

## 2015-03-19 LAB — CBC
HCT: 43.2 % (ref 39.0–52.0)
HEMOGLOBIN: 14.2 g/dL (ref 13.0–17.0)
MCH: 31.3 pg (ref 26.0–34.0)
MCHC: 32.9 g/dL (ref 30.0–36.0)
MCV: 95.4 fL (ref 78.0–100.0)
Platelets: 255 10*3/uL (ref 150–400)
RBC: 4.53 MIL/uL (ref 4.22–5.81)
RDW: 13.4 % (ref 11.5–15.5)
WBC: 6.1 10*3/uL (ref 4.0–10.5)

## 2015-03-19 NOTE — Progress Notes (Signed)
   03/19/15 0931  OBSTRUCTIVE SLEEP APNEA  Have you ever been diagnosed with sleep apnea through a sleep study? No  Do you snore loudly (loud enough to be heard through closed doors)?  1  Do you often feel tired, fatigued, or sleepy during the daytime (such as falling asleep during driving or talking to someone)? 1  Has anyone observed you stop breathing during your sleep? 0  Do you have, or are you being treated for high blood pressure? 1  BMI more than 35 kg/m2? 1  Age > 50 (1-yes) 1  Neck circumference greater than:Male 16 inches or larger, Male 17inches or larger? 1  Male Gender (Yes=1) 1  Obstructive Sleep Apnea Score 7  Score 5 or greater  Results sent to PCP

## 2015-03-19 NOTE — Pre-Procedure Instructions (Signed)
AUNDRAE BATOR  03/19/2015      [facility] #[id number] [address] [place] [address] PhoneSE:[phone] FaxXO:[phone]    Your procedure is scheduled on    Thursday  03/27/15  Report to [facility] Admitting at 530 A.M.  Call this number if you have problems the morning of surgery:  [phone]   Remember:  Do not eat food or drink liquids after midnight.  Take these medicines the morning of surgery with A SIP OF WATER   OXYCODONE IF NEEDED (STOP ASPIRIN, HERBAL MEDICINES, VIT E, MULTIVITAMIN, OSTEOBI FLEX, ROBAXIN, OMEGA 3 KRILL OIL)   Do not wear jewelry, make-up or nail polish.  Do not wear lotions, powders, or perfumes.  You may wear deodorant.  Do not shave 48 hours prior to surgery.  Men may shave face and neck.  Do not bring valuables to the hospital.  [facility] belongings or valuables.  Contacts, dentures or bridgework may not be worn into surgery.  Leave your suitcase in the car.  After surgery it may be brought to your room.  For patients admitted to the hospital, discharge time will be determined by your treatment team.  Patients discharged the day of surgery will not be allowed to drive home.   Name and phone number of your driver:   Special instructions:  [facility] - Preparing for Surgery  Before surgery, you can play an important role.  Because skin is not sterile, your skin needs to be as free of germs as possible.  You can reduce the number of germs on you skin by washing with CHG (chlorahexidine gluconate) soap before surgery.  CHG is an antiseptic cleaner which kills germs and bonds with the skin to continue killing germs even after washing.  Please DO NOT use if you have an allergy to CHG or antibacterial soaps.  If your skin becomes reddened/irritated stop using the CHG and inform your nurse when you arrive at Short Stay.  Do not shave (including legs and underarms)  for at least 48 hours prior to the first CHG shower.  You may shave your face.  Please follow these instructions carefully:   1.  Shower with CHG Soap the night before surgery and the                                morning of Surgery.  2.  If you choose to wash your hair, wash your hair first as usual with your       normal shampoo.  3.  After you shampoo, rinse your hair and body thoroughly to remove the                      Shampoo.  4.  Use CHG as you would any other liquid soap.  You can apply chg directly       to the skin and wash gently with scrungie or a clean washcloth.  5.  Apply the CHG Soap to your body ONLY FROM THE NECK DOWN.        Do not use on open wounds or open sores.  Avoid contact with your eyes,       ears, mouth and genitals (private parts).  Wash genitals (private parts)       with your normal soap.  6.  Wash  thoroughly, paying special attention to the area where your surgery        will be performed.  7.  Thoroughly rinse your body with warm water from the neck down.  8.  DO NOT shower/wash with your normal soap after using and rinsing off       the CHG Soap.  9.  Pat yourself dry with a clean towel.            10.  Wear clean pajamas.            11.  Place clean sheets on your bed the night of your first shower and do not        sleep with pets.  Day of Surgery  Do not apply any lotions/deoderants the morning of surgery.  Please wear clean clothes to the hospital/surgery center.    Please read over the following fact sheets that you were given. Pain Booklet, Coughing and Deep Breathing and Surgical Site Infection Prevention

## 2015-03-24 NOTE — Progress Notes (Signed)
Anesthesia Chart Review:  Pt is 59 year old male scheduled for laparoscopic umbilical hernia repair, possible open on 03/27/2015 with Dr. Lucia Gaskins.   PCP is Dr. Juanita Craver.   PMH includes:  HTN, melanoma, GERD. Never smoker. BMI 45. S/p R TKA. S/p L TKA 11/27/14.   For anesthesia history, he reported waking up during one of his surgeries. (He has had multiple surgeries at [facility], some under GA and others with MAC.) He did not have any issues with this during TKA in August.   Medications include: ASA, olmesartan-hctz.   Preoperative labs reviewed.    EKG 11/15/14: NSR, cannot rule out anterior infarct (age undetermined). Reverse r wave progression in V2-3 is new since 02/03/11. No CV symptoms were documented from his PAT visit.   05/20/06 Echo: Overall left ventricular systolic function was normal. Left ventricular ejection fraction was estimated to be 60 %. There was no diagnostic evidence of left ventricular regional wall motion abnormalities.  According to 05/26/06 cardiology notes, "... He underwent an adenosine Myoview [in 04/2006] which revealed no evidence of ischemia or scar with a normalejection fraction of 60%."  He is not followed by a cardiologist but was seen by Dr. Domenic Polite back in 2008 for evaluation of palpitations with associated chest tightness. Holter showed occasional PVCs but no sustained arrhythmias. He also had an unremarkable stress and echo and PRN follow-up was recommended.  If no changes, I anticipate pt can proceed with surgery as scheduled.   Willeen Cass, FNP-BC [facility]/Anesthesiology Phone: [phone] 03/24/2015 12:56 PM

## 2015-03-26 MED ORDER — CHLORHEXIDINE GLUCONATE 4 % EX LIQD: 1.0000 "application " | Freq: Once | CUTANEOUS | Status: DC

## 2015-03-26 MED ORDER — DEXTROSE 5 % IV SOLN
3.0000 g | INTRAVENOUS | Status: AC
  Administered 2015-03-27: 3 g via INTRAVENOUS
  Filled 2015-03-26: qty 3000

## 2015-03-26 NOTE — H&P (Signed)
[name]  Location: [facility] Patient #: [id number] DOB: Jul 17, 1955 Married / Language: English / Race: White Male  History of Present Illness   Patient words: hernia.   The patient is a 60 year old male who presents with an umbilical hernia.   His PCP is Dr. Loraine Leriche.  He comes by himself.   He has noticed an umbilical hernia for at least 5 years. It has become more noticeable. Causing some mild discomfort. His children tease him because he's had prior abdominal surgery - it looks like he has several "belly buttons"  He said that he had an abdominal exploration as a child - 89 yo. He thinks that he had bowel removed. It sounds like he has some suture granulomas. Then he had a repair of his hernia when he was 81. He has no history of stomach, liver, gall bladder or colon disease since then. He had a colonoscopy about 1 1/2 years ago by Dr. Collene Mares - he said that she removed 12 benign polyps. He will get his next colonoscopy 5 years from his last one.   I discussed the indications and complications of hernia surgery with the patient. I discussed both the laparoscopic and open approach to hernia repair. The potential risks of hernia surgery include, but are not limited to, bleeding, infection, open surgery, nerve injury, and recurrence of the hernia. I provided the patient literature about hernia surgery.  I think that it is worthwhile trying a laparoscopic approach. I told him, if things went well, I could assess his lower abdominal wall.  Past Medical history: 1. Rght knee replaced 2010 2. Left knee replaced - Aug 2016 - [facility] 3. Heart cath in the 1990's - but neg exam has no chronic cardiac issues. 4. HTN 5. Bilateral L4-5 laminotomies - Kritzer - 08/17/2005 6. Left thumb surgery - 02/05/2011 - [facility] Carple tunnel left hand - 07/2010 - [facility]uzma  Social History: Married. Works for [employer] - but thinking about  retiring next year.  Other Problems (Ammie Eversole, LPN; D34-534 624THL AM) Back Pain Gastroesophageal Reflux Disease High blood pressure Melanoma Umbilical Hernia Repair  Past Surgical History (Ammie Eversole, LPN; D34-534 624THL AM) Appendectomy Colon Polyp Removal - Colonoscopy Knee Surgery Bilateral. Resection of Stomach Spinal Surgery - Lower Back Tonsillectomy Vasectomy  Diagnostic Studies History (Ammie Eversole, LPN; D34-534 624THL AM) Colonoscopy 1-5 years ago  Allergies (Ammie Eversole, LPN; D34-534 [id number] AM) Penicillins  Medication History (Ammie Eversole, LPN; D34-534 [id number] AM) Oxycodone-Acetaminophen (5-325MG  Tablet, Oral) Active. Methocarbamol (500MG  Tablet, Oral) Active. Benicar HCT (20-12.5MG  Tablet, Oral) Active. Ondansetron HCl (4MG  Tablet, Oral) Active. Vitamin B Complex (Oral) Active. Multiple Vitamin (Oral) Active. Prostate 2.4 (Oral) Active. Medications Reconciled  Social History (Ammie Eversole, LPN; D34-534 624THL AM) Alcohol use Occasional alcohol use. Caffeine use Carbonated beverages, Coffee. No drug use Tobacco use Never smoker.  Family History Aleatha Borer, LPN; D34-534 624THL AM) Cancer Father. Colon Polyps Father. Diabetes Mellitus Father. Heart Disease Father. Heart disease in male family member before age 51 Hypertension Brother, Daughter, Father, Mother, Son. Melanoma Father. Prostate Cancer Brother. Respiratory Condition Father.    Review of Systems (Ammie Eversole LPN; D34-534 624THL AM) General Not Present- Appetite Loss, Chills, Fatigue, Fever, Night Sweats, Weight Gain and Weight Loss. Skin Not Present- Change in Wart/Mole, Dryness, Hives, Jaundice, New Lesions, Non-Healing Wounds, Rash and Ulcer. HEENT Present- Seasonal Allergies. Not Present- Earache, Hearing Loss, Hoarseness, Nose Bleed, Oral Ulcers, Ringing in the Ears, Sinus Pain, Sore Throat, Visual  Disturbances, Wears glasses/contact lenses and Yellow Eyes. Respiratory Present- Snoring. Not Present- Bloody sputum, Chronic Cough, Difficulty Breathing and Wheezing. Cardiovascular Not Present- Chest Pain, Difficulty Breathing Lying Down, Leg Cramps, Palpitations, Rapid Heart Rate, Shortness of Breath and Swelling of Extremities. Gastrointestinal Not Present- Abdominal Pain, Bloating, Bloody Stool, Change in Bowel Habits, Chronic diarrhea, Constipation, Difficulty Swallowing, Excessive gas, Gets full quickly at meals, Hemorrhoids, Indigestion, Nausea, Rectal Pain and Vomiting. Male Genitourinary Present- Nocturia. Not Present- Blood in Urine, Change in Urinary Stream, Frequency, Impotence, Painful Urination, Urgency and Urine Leakage. Musculoskeletal Present- Back Pain. Not Present- Joint Pain, Joint Stiffness, Muscle Pain, Muscle Weakness and Swelling of Extremities. Neurological Not Present- Decreased Memory, Fainting, Headaches, Numbness, Seizures, Tingling, Tremor, Trouble walking and Weakness. Psychiatric Not Present- Anxiety, Bipolar, Change in Sleep Pattern, Depression, Fearful and Frequent crying. Endocrine Not Present- Cold Intolerance, Excessive Hunger, Hair Changes, Heat Intolerance, Hot flashes and New Diabetes. Hematology Not Present- Easy Bruising, Excessive bleeding, Gland problems, HIV and Persistent Infections.  Vitals (Ammie Eversole LPN; [id number] AM) 02/18/2015 10:17 AM Weight: 297 lb Height: 69in Body Surface Area: 2.44 m Body Mass Index: 43.86 kg/m  Temp.: 97.55F(Oral)  Pulse: 82 (Regular)  BP: 138/88 (Sitting, Left Arm, Standard)   Physical Exam  General: WN WM alert and generally healthy appearing. HEENT: Normal. Pupils equal.  Neck: Supple. No mass. No thyroid mass. Lymph Nodes: No supraclavicular or cervical nodes.  Lungs: Clear to auscultation and symmetric breath sounds. Has tatoo across chest. Heart: RRR. No murmur or rub.  Abdomen:  Soft. No mass. No tenderness. Normal bowel sounds.   Has umbilical hernia. about 2 cm defect, but because of his size, it is a little hard to feel. He has a diastasis recti. He may have a weakness below his umbilicus (another hernia?) that covers a 4 cm area.  Extremities: Good strength and ROM in upper and lower extremities.  Neurologic: Grossly intact to motor and sensory function. Psychiatric: Has normal mood and affect. Behavior is normal.  Assessment & Plan  1.  UMBILICAL HERNIA WITH OBSTRUCTION, WITHOUT GANGRENE (K42.0)  Plan:  Will schedule laparoscopic repair of umbilical hernia.  Prior abdominal surgery as a child, so there is possibility of open surgery depending on scar tissue.  2.  DIASTASIS RECTI (M62.08)  3.  Rght knee replaced 2010  Left knee replaced - Aug 2016 - [facility] 4. Heart cath in the 1990's - but neg exam  Has no chronic cardiac issues. 5. HTN 6. Bilateral L4-5 laminotomies - Kritzer - 08/17/2005   Alphonsa Overall, MD, [facility] Pager: [phone] Office phone:  [phone]

## 2015-03-27 ENCOUNTER — Ambulatory Visit (HOSPITAL_COMMUNITY): Payer: BLUE CROSS/BLUE SHIELD | Admitting: Emergency Medicine

## 2015-03-27 ENCOUNTER — Ambulatory Visit (HOSPITAL_COMMUNITY): Payer: BLUE CROSS/BLUE SHIELD | Admitting: Anesthesiology

## 2015-03-27 ENCOUNTER — Encounter (HOSPITAL_COMMUNITY): Payer: Self-pay | Admitting: Anesthesiology

## 2015-03-27 ENCOUNTER — Inpatient Hospital Stay (HOSPITAL_COMMUNITY)
Admission: AD | Admit: 2015-03-27 | Discharge: 2015-04-02 | DRG: 355 | Disposition: A | Discharge status: Home / Self Care | Payer: BLUE CROSS/BLUE SHIELD | Source: Ambulatory Visit | Attending: Surgery | Admitting: Surgery

## 2015-03-27 ENCOUNTER — Encounter (HOSPITAL_COMMUNITY)
Admission: AD | Disposition: A | Discharge status: Home / Self Care | Payer: Self-pay | Source: Ambulatory Visit | Attending: Surgery

## 2015-03-27 DIAGNOSIS — Z8249 Family history of ischemic heart disease and other diseases of the circulatory system: Secondary | ICD-10-CM

## 2015-03-27 DIAGNOSIS — Z880 Allergy status to penicillin: Secondary | ICD-10-CM

## 2015-03-27 DIAGNOSIS — M6208 Separation of muscle (nontraumatic), other site: Secondary | ICD-10-CM | POA: Diagnosis present

## 2015-03-27 DIAGNOSIS — I10 Essential (primary) hypertension: Secondary | ICD-10-CM | POA: Diagnosis present

## 2015-03-27 DIAGNOSIS — K439 Ventral hernia without obstruction or gangrene: Secondary | ICD-10-CM | POA: Diagnosis present

## 2015-03-27 DIAGNOSIS — K219 Gastro-esophageal reflux disease without esophagitis: Secondary | ICD-10-CM | POA: Diagnosis present

## 2015-03-27 DIAGNOSIS — Z833 Family history of diabetes mellitus: Secondary | ICD-10-CM

## 2015-03-27 DIAGNOSIS — Z96653 Presence of artificial knee joint, bilateral: Secondary | ICD-10-CM | POA: Diagnosis present

## 2015-03-27 DIAGNOSIS — K420 Umbilical hernia with obstruction, without gangrene: Principal | ICD-10-CM | POA: Diagnosis present

## 2015-03-27 HISTORY — PX: UMBILICAL HERNIA REPAIR: SHX196

## 2015-03-27 SURGERY — REPAIR, HERNIA, UMBILICAL, LAPAROSCOPIC
Anesthesia: General | Site: Abdomen

## 2015-03-27 MED ORDER — PROPOFOL 10 MG/ML IV BOLUS
INTRAVENOUS | Status: AC
  Filled 2015-03-27: qty 40

## 2015-03-27 MED ORDER — OXYCODONE HCL 5 MG PO TABS
ORAL_TABLET | ORAL | Status: AC
  Filled 2015-03-27: qty 2

## 2015-03-27 MED ORDER — HYDROMORPHONE HCL 1 MG/ML IJ SOLN
0.2500 mg | INTRAMUSCULAR | Status: DC | PRN
  Administered 2015-03-27 (×4): 0.5 mg via INTRAVENOUS

## 2015-03-27 MED ORDER — ONDANSETRON HCL 4 MG/2ML IJ SOLN
INTRAMUSCULAR | Status: DC | PRN
  Administered 2015-03-27: 4 mg via INTRAVENOUS

## 2015-03-27 MED ORDER — FENTANYL CITRATE (PF) 100 MCG/2ML IJ SOLN
INTRAMUSCULAR | Status: DC | PRN
  Administered 2015-03-27 (×5): 50 ug via INTRAVENOUS

## 2015-03-27 MED ORDER — OXYCODONE HCL 5 MG PO TABS: 5.0000 mg | ORAL_TABLET | Freq: Once | ORAL | Status: DC | PRN

## 2015-03-27 MED ORDER — BUPIVACAINE HCL (PF) 0.25 % IJ SOLN
INTRAMUSCULAR | Status: AC
  Filled 2015-03-27: qty 30

## 2015-03-27 MED ORDER — SODIUM CHLORIDE 0.9 % IR SOLN
Status: DC | PRN
  Administered 2015-03-27: 1000 mL

## 2015-03-27 MED ORDER — HEPARIN SODIUM (PORCINE) 5000 UNIT/ML IJ SOLN
5000.0000 [IU] | Freq: Three times a day (TID) | INTRAMUSCULAR | Status: DC
  Administered 2015-03-27 – 2015-04-02 (×18): 5000 [IU] via SUBCUTANEOUS
  Filled 2015-03-27 (×15): qty 1

## 2015-03-27 MED ORDER — HYDROMORPHONE HCL 1 MG/ML IJ SOLN
INTRAMUSCULAR | Status: AC
  Administered 2015-03-27: 0.5 mg via INTRAVENOUS
  Filled 2015-03-27: qty 1

## 2015-03-27 MED ORDER — ONDANSETRON HCL 4 MG/2ML IJ SOLN
4.0000 mg | Freq: Four times a day (QID) | INTRAMUSCULAR | Status: DC | PRN
  Administered 2015-03-27 – 2015-04-01 (×7): 4 mg via INTRAVENOUS
  Filled 2015-03-27 (×8): qty 2

## 2015-03-27 MED ORDER — ROCURONIUM BROMIDE 100 MG/10ML IV SOLN
INTRAVENOUS | Status: DC | PRN
  Administered 2015-03-27: 10 mg via INTRAVENOUS
  Administered 2015-03-27: 5 mg via INTRAVENOUS
  Administered 2015-03-27: 30 mg via INTRAVENOUS

## 2015-03-27 MED ORDER — KCL IN DEXTROSE-NACL 20-5-0.45 MEQ/L-%-% IV SOLN
INTRAVENOUS | Status: DC
Start: 2015-03-27 — End: 2015-04-02
  Administered 2015-03-27 – 2015-04-02 (×11): via INTRAVENOUS
  Filled 2015-03-27 (×13): qty 1000

## 2015-03-27 MED ORDER — MIDAZOLAM HCL 2 MG/2ML IJ SOLN
INTRAMUSCULAR | Status: AC
  Administered 2015-03-27: 0.5 mg via INTRAVENOUS
  Filled 2015-03-27: qty 2

## 2015-03-27 MED ORDER — PROMETHAZINE HCL 25 MG/ML IJ SOLN
12.5000 mg | Freq: Four times a day (QID) | INTRAMUSCULAR | Status: DC | PRN
  Administered 2015-03-27 – 2015-04-01 (×14): 12.5 mg via INTRAVENOUS
  Filled 2015-03-27 (×14): qty 1

## 2015-03-27 MED ORDER — MIDAZOLAM HCL 2 MG/2ML IJ SOLN
INTRAMUSCULAR | Status: AC
  Filled 2015-03-27: qty 2

## 2015-03-27 MED ORDER — LIDOCAINE HCL (CARDIAC) 20 MG/ML IV SOLN
INTRAVENOUS | Status: DC | PRN
Start: 2015-03-27 — End: 2015-03-27
  Administered 2015-03-27: 50 mg via INTRAVENOUS

## 2015-03-27 MED ORDER — FENTANYL CITRATE (PF) 250 MCG/5ML IJ SOLN
INTRAMUSCULAR | Status: AC
  Filled 2015-03-27: qty 5

## 2015-03-27 MED ORDER — ONDANSETRON 4 MG PO TBDP: 4.0000 mg | ORAL_TABLET | Freq: Four times a day (QID) | ORAL | Status: DC | PRN

## 2015-03-27 MED ORDER — MIDAZOLAM HCL 2 MG/2ML IJ SOLN
0.5000 mg | Freq: Once | INTRAMUSCULAR | Status: AC
  Administered 2015-03-27: 0.5 mg via INTRAVENOUS

## 2015-03-27 MED ORDER — SUCCINYLCHOLINE CHLORIDE 20 MG/ML IJ SOLN
INTRAMUSCULAR | Status: DC | PRN
  Administered 2015-03-27: 140 mg via INTRAVENOUS

## 2015-03-27 MED ORDER — CETYLPYRIDINIUM CHLORIDE 0.05 % MT LIQD
7.0000 mL | Freq: Two times a day (BID) | OROMUCOSAL | Status: DC
  Administered 2015-03-27 – 2015-04-02 (×7): 7 mL via OROMUCOSAL

## 2015-03-27 MED ORDER — IBUPROFEN 600 MG PO TABS
600.0000 mg | ORAL_TABLET | Freq: Four times a day (QID) | ORAL | Status: DC | PRN
  Administered 2015-03-27 – 2015-03-28 (×3): 600 mg via ORAL
  Filled 2015-03-27 (×3): qty 1

## 2015-03-27 MED ORDER — ONDANSETRON HCL 4 MG/2ML IJ SOLN: 4.0000 mg | Freq: Once | INTRAMUSCULAR | Status: DC | PRN

## 2015-03-27 MED ORDER — OXYCODONE HCL 5 MG PO TABS
5.0000 mg | ORAL_TABLET | ORAL | Status: DC | PRN
  Administered 2015-03-27 – 2015-03-31 (×11): 10 mg via ORAL
  Administered 2015-03-31: 5 mg via ORAL
  Administered 2015-03-31 – 2015-04-01 (×6): 10 mg via ORAL
  Filled 2015-03-27 (×18): qty 2

## 2015-03-27 MED ORDER — LACTATED RINGERS IV SOLN
INTRAVENOUS | Status: DC | PRN
  Administered 2015-03-27 (×2): via INTRAVENOUS

## 2015-03-27 MED ORDER — OXYCODONE HCL 5 MG/5ML PO SOLN: 5.0000 mg | Freq: Once | ORAL | Status: DC | PRN

## 2015-03-27 MED ORDER — MORPHINE SULFATE (PF) 2 MG/ML IV SOLN
1.0000 mg | INTRAVENOUS | Status: DC | PRN
  Administered 2015-03-27 (×4): 4 mg via INTRAVENOUS
  Administered 2015-03-28 – 2015-03-29 (×4): 2 mg via INTRAVENOUS
  Administered 2015-03-30: 4 mg via INTRAVENOUS
  Administered 2015-03-31: 2 mg via INTRAVENOUS
  Filled 2015-03-27: qty 1
  Filled 2015-03-27: qty 2
  Filled 2015-03-27 (×3): qty 1
  Filled 2015-03-27 (×2): qty 2
  Filled 2015-03-27: qty 1
  Filled 2015-03-27 (×2): qty 2

## 2015-03-27 MED ORDER — BUPIVACAINE HCL (PF) 0.25 % IJ SOLN
INTRAMUSCULAR | Status: DC | PRN
  Administered 2015-03-27: 30 mL

## 2015-03-27 MED ORDER — MIDAZOLAM HCL 5 MG/5ML IJ SOLN
INTRAMUSCULAR | Status: DC | PRN
  Administered 2015-03-27: 2 mg via INTRAVENOUS

## 2015-03-27 MED ORDER — PHENYLEPHRINE HCL 10 MG/ML IJ SOLN
INTRAMUSCULAR | Status: DC | PRN
  Administered 2015-03-27 (×2): 40 ug via INTRAVENOUS
  Administered 2015-03-27: 80 ug via INTRAVENOUS
  Administered 2015-03-27 (×2): 40 ug via INTRAVENOUS
  Administered 2015-03-27: 80 ug via INTRAVENOUS

## 2015-03-27 MED ORDER — SUGAMMADEX SODIUM 500 MG/5ML IV SOLN
INTRAVENOUS | Status: AC
  Filled 2015-03-27: qty 5

## 2015-03-27 MED ORDER — SUGAMMADEX SODIUM 500 MG/5ML IV SOLN
INTRAVENOUS | Status: DC | PRN
  Administered 2015-03-27: 276.6 mg via INTRAVENOUS

## 2015-03-27 MED ORDER — PROPOFOL 10 MG/ML IV BOLUS
INTRAVENOUS | Status: DC | PRN
  Administered 2015-03-27: 200 mg via INTRAVENOUS

## 2015-03-27 SURGICAL SUPPLY — 45 items
APPLIER CLIP ROT 10 11.4 M/L (STAPLE)
BINDER ABD UNIV 12 45-62 (WOUND CARE) ×1 IMPLANT
BINDER ABDOMINAL 12 ML 46-62 (SOFTGOODS) ×3 IMPLANT
BINDER ABDOMINAL 46IN 62IN (WOUND CARE) ×3
BLADE SURG ROTATE 9660 (MISCELLANEOUS) ×3 IMPLANT
CANISTER SUCTION 2500CC (MISCELLANEOUS) IMPLANT
CLIP APPLIE ROT 10 11.4 M/L (STAPLE) IMPLANT
COVER SURGICAL LIGHT HANDLE (MISCELLANEOUS) ×3 IMPLANT
DEVICE SECURE STRAP 25 ABSORB (INSTRUMENTS) ×6 IMPLANT
DEVICE TROCAR PUNCTURE CLOSURE (ENDOMECHANICALS) ×3 IMPLANT
DRAPE INCISE IOBAN 66X45 STRL (DRAPES) ×3 IMPLANT
DRAPE LAPAROSCOPIC ABDOMINAL (DRAPES) ×3 IMPLANT
DRSG PAD ABDOMINAL 8X10 ST (GAUZE/BANDAGES/DRESSINGS) ×3 IMPLANT
ELECT REM PT RETURN 9FT ADLT (ELECTROSURGICAL) ×3
ELECTRODE REM PT RTRN 9FT ADLT (ELECTROSURGICAL) ×1 IMPLANT
GAUZE SPONGE 4X4 12PLY STRL (GAUZE/BANDAGES/DRESSINGS) ×3 IMPLANT
GLOVE BIO SURGEON STRL SZ7 (GLOVE) ×6 IMPLANT
GLOVE BIOGEL PI IND STRL 7.0 (GLOVE) ×2 IMPLANT
GLOVE BIOGEL PI INDICATOR 7.0 (GLOVE) ×4
GLOVE SURG SIGNA 7.5 PF LTX (GLOVE) ×3 IMPLANT
GLOVE SURG SS PI 7.0 STRL IVOR (GLOVE) ×3 IMPLANT
GOWN STRL REUS W/ TWL LRG LVL3 (GOWN DISPOSABLE) ×2 IMPLANT
GOWN STRL REUS W/ TWL XL LVL3 (GOWN DISPOSABLE) ×1 IMPLANT
GOWN STRL REUS W/TWL LRG LVL3 (GOWN DISPOSABLE) ×4
GOWN STRL REUS W/TWL XL LVL3 (GOWN DISPOSABLE) ×2
KIT BASIN OR (CUSTOM PROCEDURE TRAY) ×3 IMPLANT
KIT ROOM TURNOVER OR (KITS) ×3 IMPLANT
LIQUID BAND (GAUZE/BANDAGES/DRESSINGS) ×3 IMPLANT
MESH PARIETEX 20X15 (Mesh General) ×3 IMPLANT
NEEDLE SPNL 22GX3.5 QUINCKE BK (NEEDLE) ×3 IMPLANT
NS IRRIG 1000ML POUR BTL (IV SOLUTION) ×3 IMPLANT
PAD ARMBOARD 7.5X6 YLW CONV (MISCELLANEOUS) ×6 IMPLANT
SCALPEL HARMONIC ACE (MISCELLANEOUS) ×3 IMPLANT
SCISSORS LAP 5X35 DISP (ENDOMECHANICALS) ×3 IMPLANT
SET IRRIG TUBING LAPAROSCOPIC (IRRIGATION / IRRIGATOR) IMPLANT
STAPLER VISISTAT 35W (STAPLE) ×3 IMPLANT
SUT MON AB 5-0 PS2 18 (SUTURE) ×3 IMPLANT
SUT NOVA 0 T19/GS 22DT (SUTURE) ×6 IMPLANT
TOWEL OR 17X24 6PK STRL BLUE (TOWEL DISPOSABLE) IMPLANT
TOWEL OR 17X26 10 PK STRL BLUE (TOWEL DISPOSABLE) ×3 IMPLANT
TRAY FOLEY CATH 16FRSI W/METER (SET/KITS/TRAYS/PACK) IMPLANT
TRAY LAPAROSCOPIC MC (CUSTOM PROCEDURE TRAY) ×3 IMPLANT
TROCAR XCEL NON-BLD 11X100MML (ENDOMECHANICALS) ×3 IMPLANT
TROCAR XCEL NON-BLD 5MMX100MML (ENDOMECHANICALS) ×6 IMPLANT
TUBING INSUFFLATION (TUBING) ×3 IMPLANT

## 2015-03-27 NOTE — Op Note (Signed)
OPERATIVE NOTE  03/27/2015  8:50 AM  PATIENT:  [name], 59 y.o., male, MRN: [id number]  PREOP DIAGNOSIS:  UMBILICAL HERNIA and possible ventral hernia  POSTOP DIAGNOSIS:   Umbilical and ventral hernia (total defect is about 4 x 6 cm)  PROCEDURE:   Procedure(s): LAPAROSCOPIC UMBILICAL and ventral HERNIA repair  SURGEON:   Alphonsa Overall, M.D.  ASSISTANT:   None  ANESTHESIA:   general  Anesthesiologist: Roberts Gaudy, MD CRNA: Suzy Bouchard, CRNA  General  EBL:  minimal  ml  BLOOD ADMINISTERED: none  DRAINS: none   LOCAL MEDICATIONS USED:   30 cc 1/4% marcaine  SPECIMEN:   None  COUNTS CORRECT:  YES  INDICATIONS FOR PROCEDURE:  [name] is a 59 y.o. (DOB: 04/17/1956) white  male whose primary care physician is BURNETT,BRENT A, MD and comes for repair of umbilical and possible ventral hernia.   The indications and risks of the surgery were explained to the patient.  The risks include, but are not limited to, infection, bleeding, and nerve injury.  OPERATIVE NOTE:  The patient was taken to room 2 at [facility].  He underwent a general anesthesia.  He was given 2 grams of Ancef at the beginning of the operation.   A time out was held and the surgical checklist run.   The abdomen was prepped with Cholroprep and sterilely draped.  I covered the abdomen with a Ioban drape.   I accessed the LUQ with a 5 mm trocar.  An additional 5 mm trocar was placed in the left mid abdomen.  I later upsized the LUQ trocar to a 10 mm trocar.   He had a 2 defects, one at the umbilicus and one about 2 cm above the umbilicus.  The combined defect was about 4 x 6 cm.  There was some omentum incarcerated in the defect.  He also had adhesions to the undersurface of the peritoneum from his old surgery, but I saw no hernias.   Then I placed a 15 x 20 cm Parietex mesh in the abdomen.  It had 8 holding sutures of 0 Novafil.  These were tied down to secure the mesh to the anterior  abdominal wall.  I then used 39 Securestrap tacks to tack the mesh to the anterior abdominal wall.   The abdomen was deflated.  The mesh was inspected and there were no defects around the edge.   The trocars were then removed.  There was no bleeding at the trocar sites.  The incisions were closed with 5-0 Monocryl and painted with LiquiBand. The puncture sites were painted with LiquiBand.   The sponge and needle count were correct.  The patient had an abdominal binder placed.  The patient was transferred to the recovery room in good condition.  [I took pictures, but at the time of this note, I cannot find them in Epic.]  Type of repair - mesh  (choices - primary suture, mesh, or component)  Name of mesh - Parietex  Size of mesh - Length 20 cm, Width 15 cm  Mesh overlap - 7 cm  Placement of mesh - beneath the fascia and into peritoneal cavity  (choices - beneath fascia and into peritoneal cavity, beneath fascia but external to peritoneal cavity, between the muscle and fascia, above or external to fascia)    Alphonsa Overall, MD, FACS, scribe for [facility] Surgery Pager: [phone] Office phone:  [phone]

## 2015-03-27 NOTE — Anesthesia Postprocedure Evaluation (Signed)
Anesthesia Post Note  Patient: [name]  Procedure(s) Performed: Procedure(s) (LRB): LAPAROSCOPIC UMBILICAL HERNIA (N/A)  Patient location during evaluation: PACU Anesthesia Type: General Level of consciousness: awake and awake and alert Pain management: pain level controlled Vital Signs Assessment: post-procedure vital signs reviewed and stable Respiratory status: spontaneous breathing and nonlabored ventilation Anesthetic complications: no    Last Vitals:  Filed Vitals:   03/27/15 1045 03/27/15 1117  BP: 125/58 138/72  Pulse: 77 70  Temp:  36.9 C  Resp: 19 18    Last Pain:  Filed Vitals:   03/27/15 1248  PainSc: Asleep                 Lakima Dona COKER

## 2015-03-27 NOTE — Progress Notes (Signed)
Report given to jamie hart rn as caregiver 

## 2015-03-27 NOTE — Anesthesia Procedure Notes (Signed)
Procedure Name: Intubation Date/Time: 03/27/2015 7:27 AM Performed by: Suzy Bouchard Pre-anesthesia Checklist: Patient identified, Emergency Drugs available, Suction available, Timeout performed and Patient being monitored Patient Re-evaluated:Patient Re-evaluated prior to inductionOxygen Delivery Method: Circle system utilized Preoxygenation: Pre-oxygenation with 100% oxygen Intubation Type: IV induction and Rapid sequence Ventilation: Mask ventilation without difficulty Laryngoscope Size: Miller and 2 Grade View: Grade I Tube type: Oral Laser Tube: Cuffed inflated with minimal occlusive pressure - saline Tube size: 7.5 mm Number of attempts: 1 Airway Equipment and Method: Stylet Placement Confirmation: ETT inserted through vocal cords under direct vision,  positive ETCO2 and breath sounds checked- equal and bilateral Secured at: 23 cm Tube secured with: Tape Dental Injury: Teeth and Oropharynx as per pre-operative assessment

## 2015-03-27 NOTE — Transfer of Care (Signed)
Immediate Anesthesia Transfer of Care Note  Patient: [name]  Procedure(s) Performed: Procedure(s): LAPAROSCOPIC UMBILICAL HERNIA (N/A)  Patient Location: PACU  Anesthesia Type:General  Level of Consciousness: awake  Airway & Oxygen Therapy: Patient Spontanous Breathing and Patient connected to face mask oxygen  Post-op Assessment: Report given to RN, Post -op Vital signs reviewed and stable and Patient moving all extremities  Post vital signs: Reviewed and stable  Last Vitals:  Filed Vitals:   03/27/15 0616  BP: 147/64  Pulse: 78  Temp: 36.8 C  Resp: 20    Complications: No apparent anesthesia complications

## 2015-03-27 NOTE — Progress Notes (Signed)
Orthopedic Tech Progress Note Patient Details:  [name] Feb 18, 1956 [id number]  Ortho Devices Type of Ortho Device: Abdominal binder Ortho Device/Splint Location: abdomen Ortho Device/Splint Interventions: [name], [name] 03/27/2015, 2:54 PM

## 2015-03-27 NOTE — Anesthesia Preprocedure Evaluation (Addendum)
Anesthesia Evaluation  Patient identified by MRN, date of birth, ID band Patient awake    Reviewed: Allergy & Precautions, NPO status , Patient's Chart, lab work & pertinent test results  Airway Mallampati: II  TM Distance: >3 FB Neck ROM: Full    Dental  (+) Teeth Intact, Dental Advisory Given,    Pulmonary    breath sounds clear to auscultation       Cardiovascular hypertension, Pt. on medications  Rhythm:Regular Rate:Normal     Neuro/Psych    GI/Hepatic   Endo/Other    Renal/GU      Musculoskeletal   Abdominal (+) + obese,   Peds  Hematology   Anesthesia Other Findings   Reproductive/Obstetrics                            Anesthesia Physical Anesthesia Plan  ASA: III  Anesthesia Plan: General   Post-op Pain Management:    Induction: Intravenous  Airway Management Planned: Oral ETT  Additional Equipment:   Intra-op Plan:   Post-operative Plan: Extubation in OR  Informed Consent: I have reviewed the patients History and Physical, chart, labs and discussed the procedure including the risks, benefits and alternatives for the proposed anesthesia with the patient or authorized representative who has indicated his/her understanding and acceptance.   Dental advisory given  Plan Discussed with: CRNA and Anesthesiologist  Anesthesia Plan Comments:         Anesthesia Quick Evaluation

## 2015-03-27 NOTE — Interval H&P Note (Signed)
History and Physical Interval Note:  03/27/2015 7:12 AM  [name]  has presented today for surgery, with the diagnosis of umbilical hernia  The various methods of treatment have been discussed with the patient and family.   Wife in room with patient.  After consideration of risks, benefits and other options for treatment, the patient has consented to  Procedure(s): LAPAROSCOPIC UMBILICAL HERNIA, POSSIBLE OPEN (N/A) as a surgical intervention .  The patient's history has been reviewed, patient examined, no change in status, stable for surgery.  I have reviewed the patient's chart and labs.  Questions were answered to the patient's satisfaction.     [name]

## 2015-03-28 ENCOUNTER — Encounter (HOSPITAL_COMMUNITY): Payer: Self-pay | Admitting: Surgery

## 2015-03-28 NOTE — Progress Notes (Addendum)
General Surgery Note   POD -  1 Day Post-Op  Assessment/Plan: 1.  LAPAROSCOPIC UMBILICAL and ventral HERNIA repair - 03/27/2015 - D. Hades Mathew  Taking some po's   Will keep through the day and I'll check tonight. [Addendum:  He is much better tonight, but not ready to go home.  I have given his wife prescription for oxycodone and phenergan] 2. DIASTASIS RECTI (M62.08)  3. Rght knee replaced 2010 Left knee replaced - Aug 2016 - [facility] 4. Heart cath in the 1990's - but neg exam Has no chronic cardiac issues. 5. HTN 6. Bilateral L4-5 laminotomies - [facility]ritzer - 08/17/2005 7.  DVT prophylaxis - SQ Heparin   Active Problems:   Ventral hernia  Subjective:  Sore.  Has headache.  Wife in room.  He is not ready to go home.  Will give him the day and I will check on him tonight to see how he is doing. Objective:   Filed Vitals:   03/28/15 0551 03/28/15 1545  BP: 142/50 137/63  Pulse: 74 85  Temp: 97.8 F (36.6 C) 98.2 F (36.8 C)  Resp: 18 18     Intake/Output from previous day:  12/01 0701 - 12/02 0700 In: 3848.5 [P.O.:960; I.V.:2888.5] Out: 725 [Urine:700; Blood:25]  Intake/Output this shift:  Total I/O In: -  Out: 650 [Urine:650]   Physical Exam:   General: Obese WM who is alert and oriented.    HEENT: Normal. Pupils equal. .   Lungs: Clear.   Abdomen: Soft.  Has BS.   Wound: Dressings intact   Lab Results:   No results for input(s): WBC, HGB, HCT, PLT in the last 72 hours.  BMET  No results for input(s): NA, K, CL, CO2, GLUCOSE, BUN, CREATININE, CALCIUM in the last 72 hours.  PT/INR  No results for input(s): LABPROT, INR in the last 72 hours.  ABG  No results for input(s): PHART, HCO3 in the last 72 hours.  Invalid input(s): PCO2, PO2   Studies/Results:  No results found.   Anti-infectives:   Anti-infectives    Start     Dose/Rate Route Frequency Ordered Stop   03/27/15 0645  ceFAZolin (ANCEF) 3 g in dextrose 5 % 50 mL IVPB     3  g 160 mL/hr over 30 Minutes Intravenous To [facility] 03/26/15 1040 03/27/15 0722      Alphonsa Overall, MD, FACS Pager: [facility] Office: [phone] 03/28/2015

## 2015-03-29 DIAGNOSIS — Z880 Allergy status to penicillin: Secondary | ICD-10-CM | POA: Diagnosis not present

## 2015-03-29 DIAGNOSIS — M6208 Separation of muscle (nontraumatic), other site: Secondary | ICD-10-CM | POA: Diagnosis present

## 2015-03-29 DIAGNOSIS — Z8249 Family history of ischemic heart disease and other diseases of the circulatory system: Secondary | ICD-10-CM | POA: Diagnosis not present

## 2015-03-29 DIAGNOSIS — K219 Gastro-esophageal reflux disease without esophagitis: Secondary | ICD-10-CM | POA: Diagnosis present

## 2015-03-29 DIAGNOSIS — I10 Essential (primary) hypertension: Secondary | ICD-10-CM | POA: Diagnosis present

## 2015-03-29 DIAGNOSIS — K420 Umbilical hernia with obstruction, without gangrene: Secondary | ICD-10-CM | POA: Diagnosis present

## 2015-03-29 DIAGNOSIS — Z833 Family history of diabetes mellitus: Secondary | ICD-10-CM | POA: Diagnosis not present

## 2015-03-29 DIAGNOSIS — Z96653 Presence of artificial knee joint, bilateral: Secondary | ICD-10-CM | POA: Diagnosis present

## 2015-03-29 DIAGNOSIS — K439 Ventral hernia without obstruction or gangrene: Secondary | ICD-10-CM | POA: Diagnosis present

## 2015-03-29 MED ORDER — HYDROCHLOROTHIAZIDE 12.5 MG PO CAPS
12.5000 mg | ORAL_CAPSULE | Freq: Every day | ORAL | Status: DC
  Administered 2015-03-29 – 2015-04-02 (×5): 12.5 mg via ORAL
  Filled 2015-03-29 (×5): qty 1

## 2015-03-29 MED ORDER — IRBESARTAN 150 MG PO TABS
150.0000 mg | ORAL_TABLET | Freq: Every day | ORAL | Status: DC
  Administered 2015-03-29 – 2015-04-02 (×5): 150 mg via ORAL
  Filled 2015-03-29 (×5): qty 1

## 2015-03-29 MED ORDER — IBUPROFEN 600 MG PO TABS
600.0000 mg | ORAL_TABLET | Freq: Three times a day (TID) | ORAL | Status: DC
  Administered 2015-04-01: 600 mg via ORAL
  Filled 2015-03-29 (×3): qty 1

## 2015-03-29 NOTE — Progress Notes (Signed)
Patient ID: [name], male   DOB: 1956-02-20, 59 y.o.   MRN: [id number]     [address] [place], [place] [id number]    Phone: [phone] FAX: [phone]     Subjective: Vomited this AM.  No flatus. Walked x1 yesterday.  Sat up in chair.  Voiding.  Objective:  Vital signs:  Filed Vitals:   03/28/15 0551 03/28/15 1545 03/28/15 2200 03/29/15 0526  BP: 142/50 137/63 139/71 146/71  Pulse: 74 85 79 73  Temp: 97.8 F (36.6 C) 98.2 F (36.8 C) 98.5 F (36.9 C) 98.1 F (36.7 C)  TempSrc: Oral Oral Oral Oral  Resp: 18 18 18 18   Height:    5\' 9"  (1.753 m)  Weight:    142.6 kg (314 lb 6 oz)  SpO2: 97% 95% 94% 95%    Last BM Date: 03/27/15  Intake/Output   Yesterday:  12/02 0701 - 12/03 0700 In: 240 [P.O.:240] Out: 1400 [Urine:1400] This shift:  Total I/O In: 111 [P.O.:111] Out: 900 [Urine:900]   Physical Exam: General: Pt awake/alert/oriented x4 in no acute distress  Abdomen: +BS, soft, distended, incisions are c/d/i.  Appropriately tender.  abd binder.     Problem List:   Active Problems:   Ventral hernia    Results:   Labs: No results found for this or any previous visit (from the past 61 hour(s)).  Imaging / Studies: No results found.  Medications / Allergies:  Scheduled Meds: . antiseptic oral rinse  7 mL Mouth Rinse BID  . heparin  5,000 Units Subcutaneous 3 times per day   Continuous Infusions: . dextrose 5 % and 0.45 % NaCl with KCl 20 mEq/L 75 mL/hr at 03/28/15 1352   PRN Meds:.ibuprofen, morphine injection, ondansetron **OR** ondansetron (ZOFRAN) IV, oxyCODONE, promethazine  Antibiotics: Anti-infectives    Start     Dose/Rate Route Frequency Ordered Stop   03/27/15 0645  ceFAZolin (ANCEF) 3 g in dextrose 5 % 50 mL IVPB     3 g 160 mL/hr over 30 Minutes Intravenous To [facility] 03/26/15 1040 03/27/15 0722        Assessment/Plan POD#2 laparoscopic  umbilical hernia repair Post op ileus -back down to fulls, slowly advance -needs to mobilize -IS, abdominal binder VTE prophylaxis-SCD/heparin FEN-continue IVF, change to scheduled Ibuprofen Dispo-continue inpatient   Erby Pian, ANP-BC [facility]   03/29/2015 9:22 AM

## 2015-03-29 NOTE — Progress Notes (Signed)
Pt having postop nausea and vomiting.  Medicated with Phenergan, cool cloth, fan.

## 2015-03-30 MED ORDER — BIOTENE DRY MOUTH MT LIQD: 15.0000 mL | OROMUCOSAL | Status: DC | PRN

## 2015-03-30 NOTE — Progress Notes (Signed)
3 Days Post-Op  Subjective: Con't with n/v overnight.  Flatus, No BM  Objective: Vital signs in last 24 hours: Temp:  [97.5 F (36.4 C)-98.2 F (36.8 C)] 97.5 F (36.4 C) (12/04 0546) Pulse Rate:  [74-83] 74 (12/04 0546) Resp:  [16-18] 16 (12/04 0546) BP: (133-174)/(79-83) 133/79 mmHg (12/04 0546) SpO2:  [95 %-96 %] 96 % (12/04 0546) Last BM Date: 03/29/15  Intake/Output from previous day: 12/03 0701 - 12/04 0700 In: 2461 [P.O.:551; I.V.:1910] Out: 1700 [Urine:1700] Intake/Output this shift:    General appearance: alert and cooperative GI: soft, active BS, no peritonitis   Anti-infectives: Anti-infectives    Start     Dose/Rate Route Frequency Ordered Stop   03/27/15 0645  ceFAZolin (ANCEF) 3 g in dextrose 5 % 50 mL IVPB     3 g 160 mL/hr over 30 Minutes Intravenous To [facility] 03/26/15 1040 03/27/15 0722      Assessment/Plan: s/p Procedure(s): LAPAROSCOPIC UMBILICAL HERNIA (N/A)  Post Op Ileus NPO  Bowel rest Mobilize CBC in AM   LOS: 1 day    Rosario Jacks., Yaritzy Huser 03/30/2015

## 2015-03-31 LAB — CBC
HCT: 40.5 % (ref 39.0–52.0)
Hemoglobin: 13 g/dL (ref 13.0–17.0)
MCH: 31 pg (ref 26.0–34.0)
MCHC: 32.1 g/dL (ref 30.0–36.0)
MCV: 96.4 fL (ref 78.0–100.0)
PLATELETS: 255 10*3/uL (ref 150–400)
RBC: 4.2 MIL/uL — ABNORMAL LOW (ref 4.22–5.81)
RDW: 13.4 % (ref 11.5–15.5)
WBC: 10.7 10*3/uL — ABNORMAL HIGH (ref 4.0–10.5)

## 2015-03-31 MED ORDER — ALUM & MAG HYDROXIDE-SIMETH 200-200-20 MG/5ML PO SUSP
30.0000 mL | Freq: Four times a day (QID) | ORAL | Status: DC | PRN
  Administered 2015-03-31 (×2): 30 mL via ORAL
  Filled 2015-03-31 (×3): qty 30

## 2015-03-31 NOTE — Progress Notes (Signed)
General Surgery Note  LOS: 2 days  POD -  4 Days Post-Op  Assessment/Plan: 1.  LAPAROSCOPIC UMBILICAL and ventral HERNIA repair - 03/27/2015 - D. [employer] weekend  But seems a little better, will try clear liquids.  2. DIASTASIS RECTI (M62.08)  3. Rght knee replaced 2010 Left knee replaced - Aug 2016 - [facility] 4. Heart cath in the 1990's - but neg exam Has no chronic cardiac issues. 5. HTN 6. Bilateral L4-5 laminotomies - [facility]ritzer - 08/17/2005 7.  DVT prophylaxis - SQ Heparin   Active Problems:   Ventral hernia  Subjective:  Somewhat better.  Will try clear liquids.  Objective:   Filed Vitals:   03/30/15 2146 03/31/15 0501  BP: 149/69 139/84  Pulse: 78 75  Temp: 97.9 F (36.6 C) 97.8 F (36.6 C)  Resp: 18 18     Intake/Output from previous day:  12/04 0701 - 12/05 0700 In: 1905 [I.V.:1905] Out: 1700 [Urine:1700]  Intake/Output this shift:      Physical Exam:   General: Obese WM who is alert and oriented.    HEENT: Normal. Pupils equal. .   Lungs: Clear.   Abdomen: Soft.  Has BS.   Wound: Clean   Lab Results:     Recent Labs  03/31/15 0029  WBC 10.7*  HGB 13.0  HCT 40.5  PLT 255    BMET  No results for input(s): NA, K, CL, CO2, GLUCOSE, BUN, CREATININE, CALCIUM in the last 72 hours.  PT/INR  No results for input(s): LABPROT, INR in the last 72 hours.  ABG  No results for input(s): PHART, HCO3 in the last 72 hours.  Invalid input(s): PCO2, PO2   Studies/Results:  No results found.   Anti-infectives:   Anti-infectives    Start     Dose/Rate Route Frequency Ordered Stop   03/27/15 0645  ceFAZolin (ANCEF) 3 g in dextrose 5 % 50 mL IVPB     3 g 160 mL/hr over 30 Minutes Intravenous To [facility] 03/26/15 1040 03/27/15 0722      Alphonsa Overall, MD, FACS Pager: [facility] Office: [phone] 03/31/2015

## 2015-03-31 NOTE — Care Management Note (Signed)
Case Management Note  Patient Details  Name: [name] MRN: [id number] Date of Birth: 24-Nov-1955  Subjective/Objective:                    Action/Plan:   Expected Discharge Date:                  Expected Discharge Plan:  Home/Self Care  In-House Referral:     Discharge planning Services     Post Acute Care Choice:    Choice offered to:     DME Arranged:    DME Agency:     HH Arranged:    [place] Agency:     Status of Service:  In process, will continue to follow  Medicare Important Message Given:    Date Medicare IM Given:    Medicare IM give by:    Date Additional Medicare IM Given:    Additional Medicare Important Message give by:     If discussed at [place] of Stay Meetings, dates discussed:    Additional Comments: UR updated  Marilu Favre, RN 03/31/2015, 2:16 PM

## 2015-04-01 NOTE — Progress Notes (Signed)
General Surgery Note  LOS: 3 days  POD -  5 Days Post-Op  Assessment/Plan: 1.  LAPAROSCOPIC UMBILICAL and ventral HERNIA repair - 03/27/2015 - D. Cleophas Yoak  Doing much better.  Taking po's and has had BM.  Still not quite ready to go home.  Will plan to keep tonight and reassess in AM. 2. DIASTASIS RECTI (M62.08)  3. Rght knee replaced 2010 Left knee replaced - Aug 2016 - [facility] 4. Heart cath in the 1990's - but neg exam Has no chronic cardiac issues. 5. HTN 6. Bilateral L4-5 laminotomies - [facility] - 08/17/2005 7.  DVT prophylaxis - SQ Heparin   Active Problems:   Ventral hernia  Subjective:  Has been up much of the day.  Had a BM.  Taking liquids, though soup did not go down that well. Objective:   Filed Vitals:   04/01/15 0512 04/01/15 1359  BP: 167/65 141/74  Pulse: 71 82  Temp: 98.1 F (36.7 C) 98.1 F (36.7 C)  Resp: 16 18     Intake/Output from previous day:  12/05 0701 - 12/06 0700 In: 1917.5 [P.O.:120; I.V.:1797.5] Out: [id number] [Urine:1785]  Intake/Output this shift:      Physical Exam:   General: Obese WM who is alert and oriented.  Looks better in face.   HEENT: Normal. Pupils equal. .   Lungs: Clear.   Abdomen: Soft.  Has BS.   Wound: Clean.  Points to soreness in RLQ.   Lab Results:     Recent Labs  03/31/15 0029  WBC 10.7*  HGB 13.0  HCT 40.5  PLT 255    BMET  No results for input(s): NA, K, CL, CO2, GLUCOSE, BUN, CREATININE, CALCIUM in the last 72 hours.  PT/INR  No results for input(s): LABPROT, INR in the last 72 hours.  ABG  No results for input(s): PHART, HCO3 in the last 72 hours.  Invalid input(s): PCO2, PO2   Studies/Results:  No results found.   Anti-infectives:   Anti-infectives    Start     Dose/Rate Route Frequency Ordered Stop   03/27/15 0645  ceFAZolin (ANCEF) 3 g in dextrose 5 % 50 mL IVPB     3 g 160 mL/hr over 30 Minutes Intravenous To [facility] 03/26/15 1040 03/27/15 0722       Alphonsa Overall, MD, FACS Pager: [facility] Office: [phone] 04/01/2015

## 2015-04-02 NOTE — Discharge Summary (Signed)
Physician Discharge Summary  Patient ID:  SOROUSH DAI  MRN: [id number]  DOB/AGE: Jun 18, 1955 59 y.o.  Admit date: 03/27/2015 Discharge date: 04/02/2015  Discharge Diagnoses:  1. UMBILICAL HERNIA and ventral hernia, incarcerated with omentum.  2. DIASTASIS RECTI (M62.08) 3. Rght knee replaced 2010 Left knee replaced - Aug 2016 - [facility] 4. Heart cath in the 1990's - but neg exam Has no chronic cardiac issues. 5. HTN 6. Bilateral L4-5 laminotomies - [facility] - 08/17/2005 7.  Post op ileus - resolved.   Active Problems:   Ventral hernia  Operation: Procedure(s):  LAPAROSCOPIC UMBILICAL and ventral HERNIA repair on 03/27/2015 - D. Lucia Gaskins  Discharged Condition: good  Hospital Course: [name] is an 59 y.o. male whose primary care physician is Stephens Shire, MD and who was admitted 03/27/2015 with a chief complaint of umbilical hernia.  He had had a prior exploration as a child.  He was brought to the operating room on 03/27/2015 and underwent  LAPAROSCOPIC UMBILICAL and ventral HERNIA repair.   Post op, he developed an ileus with nausea and vomiting.  He was kept NPO until 03/31/2015, when he was restarted on a diet.  He is taking full liquids, has had a BM, and is ready to go home.  The discharge instructions were reviewed with the patient.  Consults: None  Significant Diagnostic Studies: Results for orders placed or performed during the hospital encounter of 03/27/15  CBC  Result Value Ref Range   WBC 10.7 (H) 4.0 - 10.5 K/uL   RBC 4.20 (L) 4.22 - 5.81 MIL/uL   Hemoglobin 13.0 13.0 - 17.0 g/dL   HCT 40.5 39.0 - 52.0 %   MCV 96.4 78.0 - 100.0 fL   MCH 31.0 26.0 - 34.0 pg   MCHC 32.1 30.0 - 36.0 g/dL   RDW 13.4 11.5 - 15.5 %   Platelets 255 150 - 400 K/uL    No results found.  Discharge Exam:  Filed Vitals:   04/01/15 2251 04/02/15 0405  BP: 138/66 142/56  Pulse: 75 73  Temp: 98.6 F (37 C) 98.7 F (37.1 C)  Resp: 19 19     General: Obese WM who is alert and generally healthy appearing.  Lungs: Clear to auscultation and symmetric breath sounds. Heart:  RRR. No murmur or rub. Abdomen: Soft. Normal bowel sounds. Incisions look good.  Discharge Medications:     Medication List    STOP taking these medications        apixaban 2.5 MG Tabs tablet  Commonly known as:  ELIQUIS      TAKE these medications        aspirin 81 MG tablet  Take 81 mg by mouth daily.     bisacodyl 5 MG EC tablet  Commonly known as:  DULCOLAX  Take 1 tablet (5 mg total) by mouth daily as needed for moderate constipation.     [employer] OMEGA-3 KRILL OIL 300 MG Caps  Take 300 mg by mouth daily.     diazepam 2 MG tablet  Commonly known as:  VALIUM  Take 1 tablet (2 mg total) by mouth every 12 (twelve) hours as needed for muscle spasms.     methocarbamol 500 MG tablet  Commonly known as:  ROBAXIN  Take 1 tablet (500 mg total) by mouth 4 (four) times daily.     multivitamin with minerals Tabs tablet  Take 1 tablet by mouth daily.     olmesartan-hydrochlorothiazide 20-12.5 MG tablet  Commonly known as:  BENICAR HCT  Take 1 tablet by mouth daily.     ondansetron 4 MG tablet  Commonly known as:  ZOFRAN  Take 1 tablet (4 mg total) by mouth every 8 (eight) hours as needed for nausea or vomiting.     OSTEO BI-FLEX ADV DOUBLE ST Tabs  Take 1 tablet by mouth daily.     oxyCODONE 10 mg 12 hr tablet  Commonly known as:  OXYCONTIN  Take 1 tablet (10 mg total) by mouth every 12 (twelve) hours.     oxyCODONE-acetaminophen 5-325 MG tablet  Commonly known as:  ROXICET  Take 1-2 tablets by mouth every 4 (four) hours as needed.     PROSTATE PO  Take 1 tablet by mouth daily.     VITAMIN B COMPLEX PO  Take 1 tablet by mouth daily.     vitamin E 400 UNIT capsule  Take 400 Units by mouth daily.        Disposition: 06-Home-Health Care Svc      Discharge Instructions    Diet - low sodium heart healthy    Complete by:  As  directed      Increase activity slowly    Complete by:  As directed               Signed: Alphonsa Overall, M.D., [facility] Office:  [phone]  04/02/2015, 8:41 AM

## 2015-04-02 NOTE — Discharge Instructions (Signed)
[facility] - DISCHARGE INSTRUCTIONS TO PATIENT  Return to work on:  5 to 7 days.  Activity:  Driving - May drive in 2 or 3 days, if off pain meds and doing well   Lifting - No lifting more than 15 pounds for 4 weeks.  Wound Care:   Okay to shower.  Diet:  As tolerated.  Eat light for a fewd days.  Follow up appointment:  Call Dr. Pollie Friar office [facility]) at [phone] for an appointment in 1 to 2 weeks.  Medications and dosages:  Resume your home medications.  You have a prescription for:  Oxycodone and phenergan  Call Dr. Lucia Gaskins or his office  [phone]) if you have:  Temperature greater than 100.4,  Persistent nausea and vomiting,  Severe uncontrolled pain,  Redness, tenderness, or signs of infection (pain, swelling, redness, odor or green/yellow discharge around the site),  Difficulty breathing, headache or visual disturbances,  Any other questions or concerns you may have after discharge.  In an emergency, call 911 or go to an Emergency Department at a nearby hospital.

## 2015-04-02 NOTE — Progress Notes (Signed)
IV removed. Patient has prescriptions. Belongings packed. AVS given to patient, understanding demonstrated.  Transportation arranged by patient.

## 2016-06-22 ENCOUNTER — Other Ambulatory Visit: Payer: Self-pay | Admitting: Orthopedic Surgery

## 2016-06-22 DIAGNOSIS — M25511 Pain in right shoulder: Secondary | ICD-10-CM

## 2016-06-28 ENCOUNTER — Ambulatory Visit
Admission: RE | Admit: 2016-06-28 | Discharge: 2016-06-28 | Disposition: A | Discharge status: Home / Self Care | Payer: Commercial Managed Care - PPO | Source: Ambulatory Visit | Attending: Orthopedic Surgery | Admitting: Orthopedic Surgery

## 2016-06-28 DIAGNOSIS — M25511 Pain in right shoulder: Secondary | ICD-10-CM

## 2017-06-24 IMAGING — MR MR SHOULDER*R* W/O CM
5 series · 36 of 40 positions shown · non-contrast
Comparison: None.

CLINICAL DATA: Right shoulder pain 2 months.

EXAM:
MRI OF THE RIGHT SHOULDER WITHOUT CONTRAST
TECHNIQUE: Multiplanar, multisequence MR imaging of the shoulder was performed.
No intravenous contrast was administered.

[Series 3: T2 fat-sat · axial · 4.0mm · 0.55mm/px · z∈[-7,+67]mm · 8 of 17 slices shown (1 of 3)]
[im 1/17]
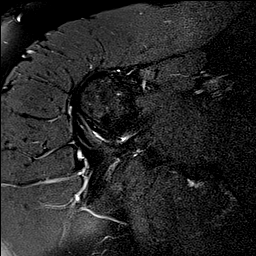
[im 3/17]
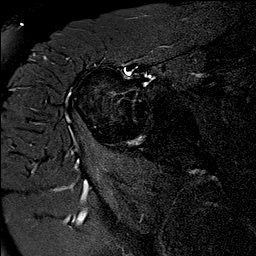
[im 5/17]
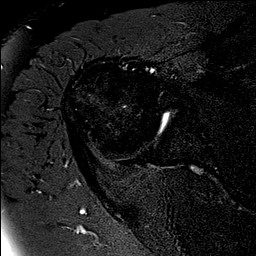
[im 7/17]
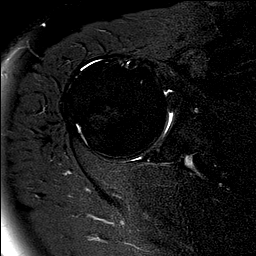
[im 10/17]
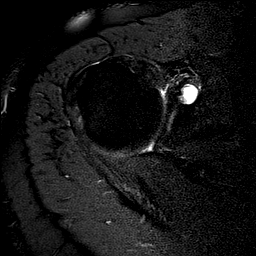
[im 12/17]
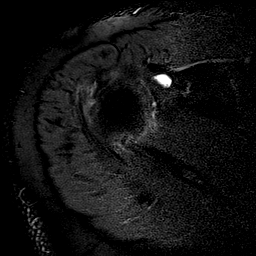
[im 14/17]
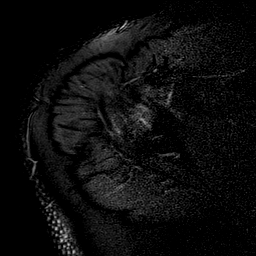
[im 17/17]
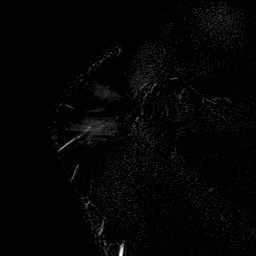

[Series 4: T2 fat-sat · oblique · 4.0mm · 0.55mm/px · 8 of 18 slices shown (2 of 3)]
[im 1/18]
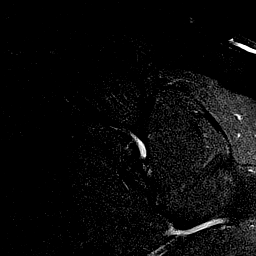
[im 3/18]
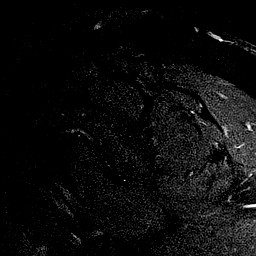
[im 5/18]
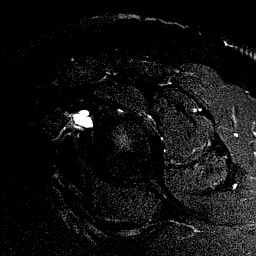
[im 8/18]
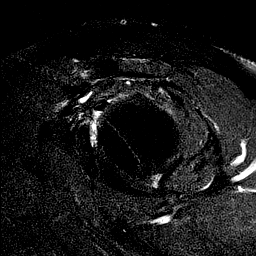
[im 10/18]
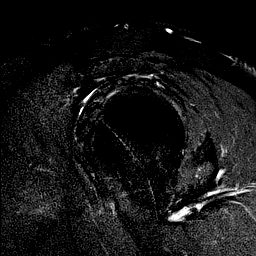
[im 13/18]
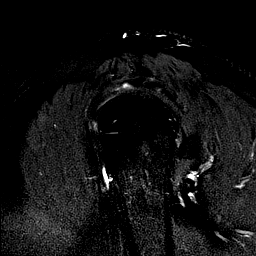
[im 15/18]
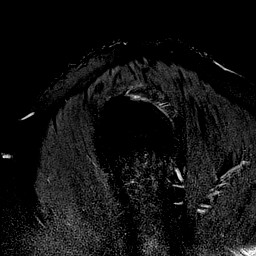
[im 18/18]
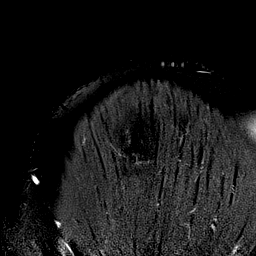

[Series 5: T2 fat-sat · oblique · 4.0mm · 0.27mm/px · 8 of 17 slices shown (3 of 3)]
[im 1/17]
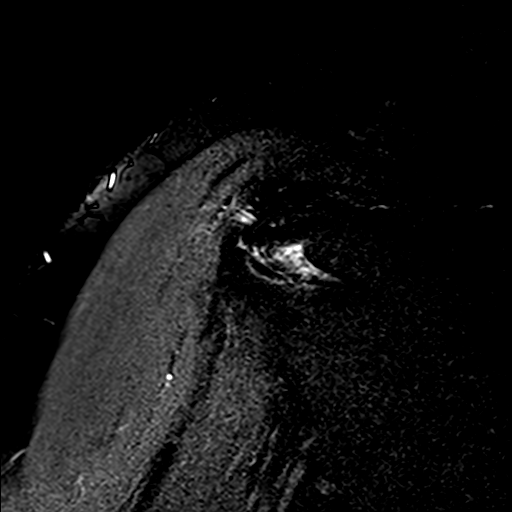
[im 3/17]
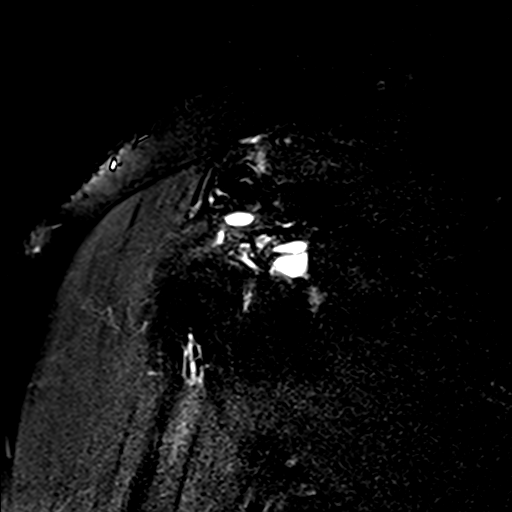
[im 5/17]
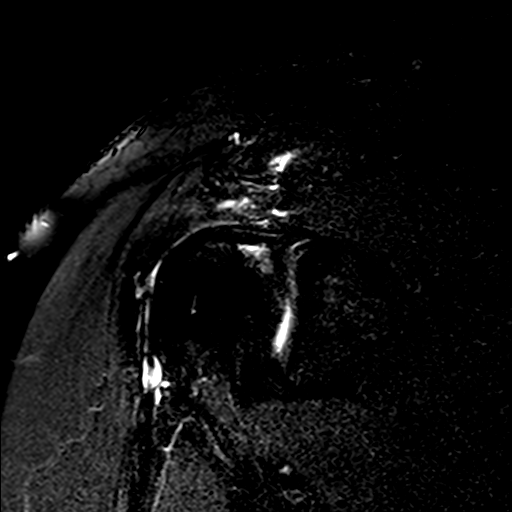
[im 7/17]
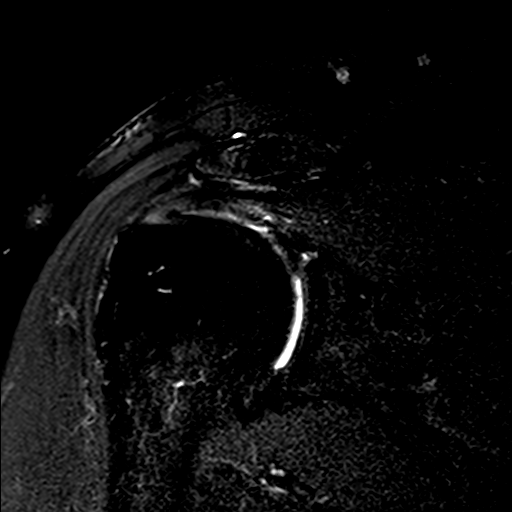
[im 10/17]
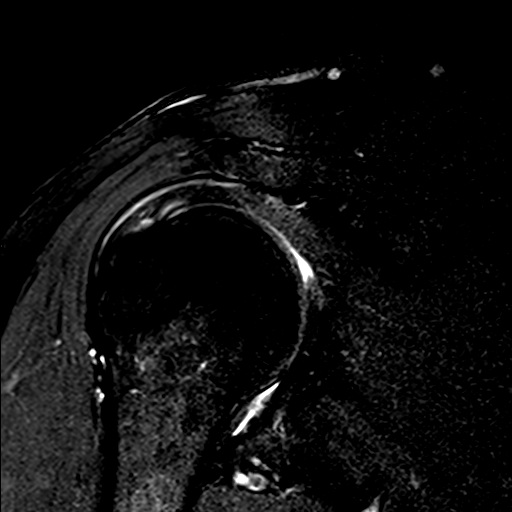
[im 12/17]
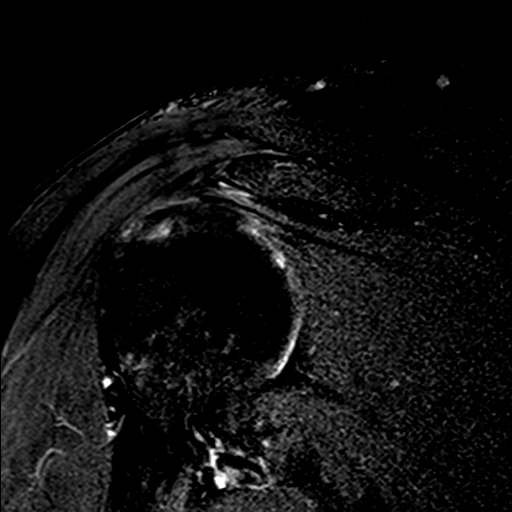
[im 14/17]
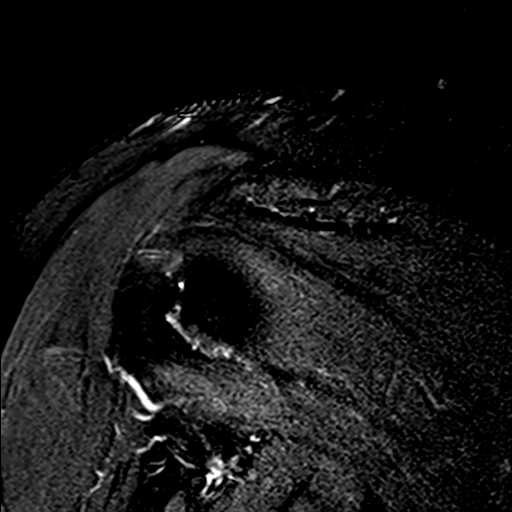
[im 17/17]
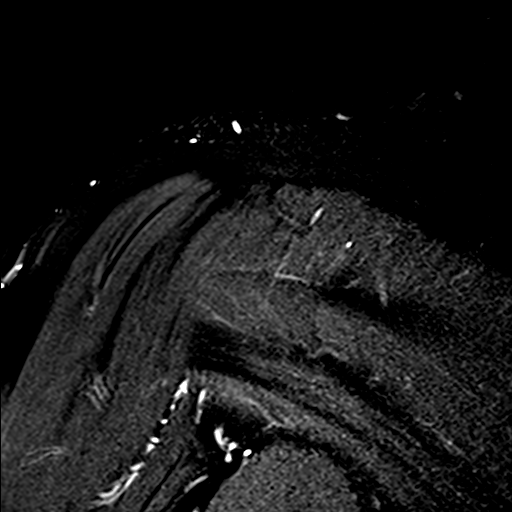

[Series 6: T1 · oblique · 4.0mm · 0.27mm/px · 4 of 18 slices shown]
[im 1/18]
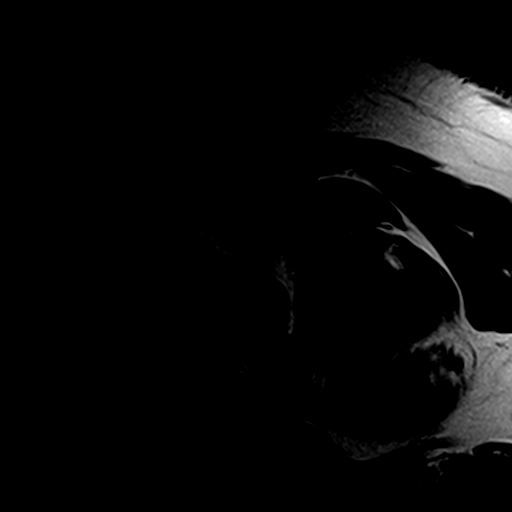
[im 3/18]
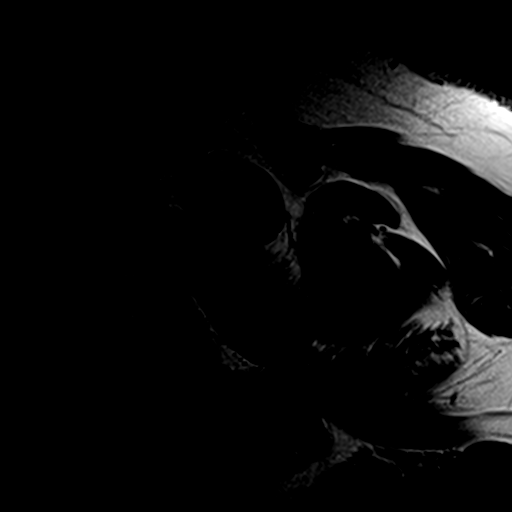
[im 5/18]
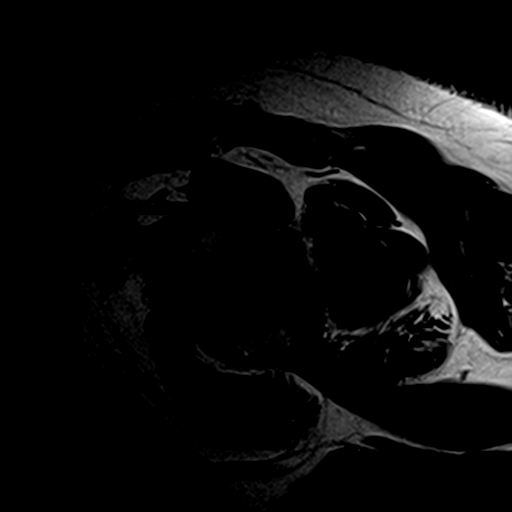
[im 8/18]
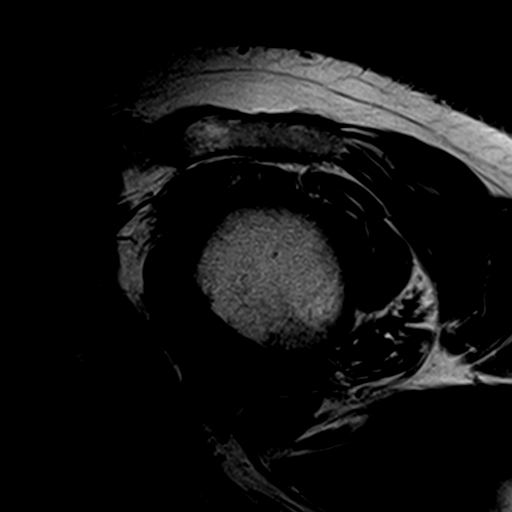

[Series 7: PD · oblique · 4.0mm · 0.55mm/px · 8 of 17 slices shown]
[im 1/17]
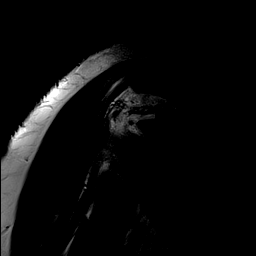
[im 3/17]
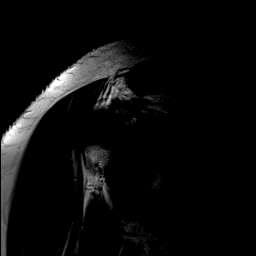
[im 5/17]
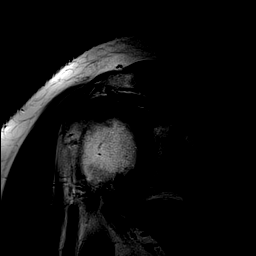
[im 7/17]
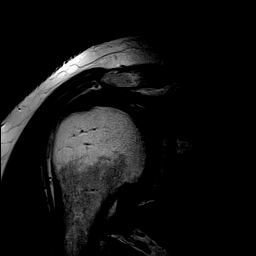
[im 10/17]
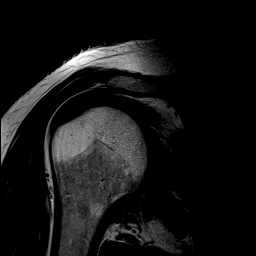
[im 12/17]
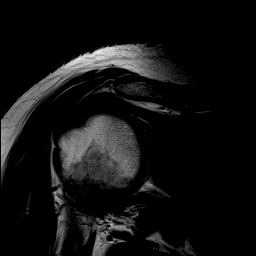
[im 14/17]
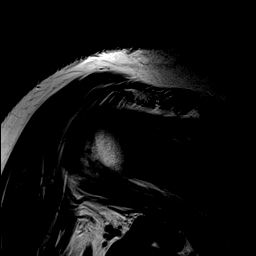
[im 17/17]
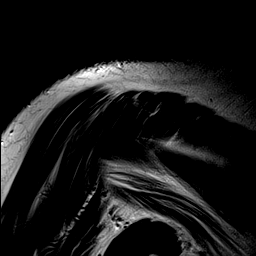

[36 of 40 positions shown; findings below may reference images not displayed]

FINDINGS: Rotator cuff: There is slight tendinopathy of the distal
supraspinous and infraspinatus tendons but there is no rotator cuff
tear.

Muscles:  Normal.

Biceps long head:  Properly located and intact.

Acromioclavicular Joint:  Normal.  Type 2 acromion. No bursitis.

Glenohumeral Joint: No joint effusion. No chondral defect.

Labrum: Grossly intact, but evaluation is limited by lack of
intraarticular fluid.

Bones:  No marrow abnormality, fracture or dislocation.

Other: None
IMPRESSION: Slight degenerative changes of the distal infraspinatus and
supraspinous tendons. Otherwise, essentially normal exam.
